# Patient Record
Sex: Female | Born: 1945 | Race: White | Hispanic: No | Marital: Married | Smoking: Never smoker
Health system: Southern US, Community
[De-identification: ages and names within clinical notes are randomized; demographics above are authoritative.]

## PROBLEM LIST (undated history)

## (undated) DIAGNOSIS — R51 Headache: Secondary | ICD-10-CM

## (undated) DIAGNOSIS — E039 Hypothyroidism, unspecified: Secondary | ICD-10-CM

## (undated) DIAGNOSIS — C801 Malignant (primary) neoplasm, unspecified: Secondary | ICD-10-CM

## (undated) DIAGNOSIS — I10 Essential (primary) hypertension: Secondary | ICD-10-CM

## (undated) DIAGNOSIS — G4730 Sleep apnea, unspecified: Secondary | ICD-10-CM

## (undated) DIAGNOSIS — R519 Headache, unspecified: Secondary | ICD-10-CM

## (undated) HISTORY — DX: Malignant (primary) neoplasm, unspecified: C80.1

## (undated) HISTORY — DX: Essential (primary) hypertension: I10

## (undated) HISTORY — PX: APPENDECTOMY: SHX54

## (undated) HISTORY — PX: SKIN CANCER EXCISION: SHX779

## (undated) HISTORY — PX: MOUTH SURGERY: SHX715

## (undated) HISTORY — PX: TEAR DUCT PROBING: SHX793

---

## 1998-06-13 ENCOUNTER — Encounter: Admission: RE | Admit: 1998-06-13 | Discharge: 1998-07-29 | Payer: Self-pay | Admitting: Internal Medicine

## 1998-07-12 ENCOUNTER — Encounter: Payer: Self-pay | Admitting: Internal Medicine

## 1998-07-12 ENCOUNTER — Ambulatory Visit (HOSPITAL_COMMUNITY): Admission: RE | Admit: 1998-07-12 | Discharge: 1998-07-12 | Payer: Self-pay | Admitting: Internal Medicine

## 1998-08-13 ENCOUNTER — Encounter: Admission: RE | Admit: 1998-08-13 | Discharge: 1998-11-11 | Payer: Self-pay | Admitting: Anesthesiology

## 1999-03-20 ENCOUNTER — Encounter: Admission: RE | Admit: 1999-03-20 | Discharge: 1999-06-18 | Payer: Self-pay | Admitting: Anesthesiology

## 2003-12-06 ENCOUNTER — Other Ambulatory Visit: Admission: RE | Admit: 2003-12-06 | Discharge: 2003-12-06 | Payer: Self-pay | Admitting: Obstetrics and Gynecology

## 2005-01-21 ENCOUNTER — Other Ambulatory Visit: Admission: RE | Admit: 2005-01-21 | Discharge: 2005-01-21 | Payer: Self-pay | Admitting: Obstetrics and Gynecology

## 2006-05-06 ENCOUNTER — Other Ambulatory Visit: Admission: RE | Admit: 2006-05-06 | Discharge: 2006-05-06 | Payer: Self-pay | Admitting: Obstetrics and Gynecology

## 2006-06-23 ENCOUNTER — Encounter: Admission: RE | Admit: 2006-06-23 | Discharge: 2006-06-23 | Payer: Self-pay | Admitting: Internal Medicine

## 2006-07-06 ENCOUNTER — Encounter (HOSPITAL_COMMUNITY): Admission: RE | Admit: 2006-07-06 | Discharge: 2006-07-07 | Payer: Self-pay | Admitting: Internal Medicine

## 2006-07-30 ENCOUNTER — Ambulatory Visit (HOSPITAL_COMMUNITY): Admission: RE | Admit: 2006-07-30 | Discharge: 2006-07-30 | Payer: Self-pay | Admitting: Internal Medicine

## 2006-07-30 ENCOUNTER — Encounter (INDEPENDENT_AMBULATORY_CARE_PROVIDER_SITE_OTHER): Payer: Self-pay | Admitting: *Deleted

## 2007-05-09 ENCOUNTER — Other Ambulatory Visit: Admission: RE | Admit: 2007-05-09 | Discharge: 2007-05-09 | Payer: Self-pay | Admitting: Obstetrics and Gynecology

## 2008-05-14 ENCOUNTER — Other Ambulatory Visit: Admission: RE | Admit: 2008-05-14 | Discharge: 2008-05-14 | Payer: Self-pay | Admitting: Obstetrics and Gynecology

## 2009-05-20 ENCOUNTER — Other Ambulatory Visit: Admission: RE | Admit: 2009-05-20 | Discharge: 2009-05-20 | Payer: Self-pay | Admitting: Obstetrics and Gynecology

## 2010-06-10 ENCOUNTER — Other Ambulatory Visit: Admission: RE | Admit: 2010-06-10 | Discharge: 2010-06-10 | Payer: Self-pay | Admitting: Obstetrics and Gynecology

## 2010-08-31 ENCOUNTER — Encounter: Payer: Self-pay | Admitting: Internal Medicine

## 2010-10-23 ENCOUNTER — Other Ambulatory Visit: Payer: Self-pay | Admitting: Dermatology

## 2010-12-22 ENCOUNTER — Encounter (INDEPENDENT_AMBULATORY_CARE_PROVIDER_SITE_OTHER): Payer: Self-pay | Admitting: Surgery

## 2011-03-24 ENCOUNTER — Other Ambulatory Visit (INDEPENDENT_AMBULATORY_CARE_PROVIDER_SITE_OTHER): Payer: Self-pay | Admitting: Surgery

## 2011-03-24 DIAGNOSIS — E042 Nontoxic multinodular goiter: Secondary | ICD-10-CM

## 2011-03-26 ENCOUNTER — Ambulatory Visit
Admission: RE | Admit: 2011-03-26 | Discharge: 2011-03-26 | Disposition: A | Discharge status: Home / Self Care | Payer: Medicare Other | Source: Ambulatory Visit | Attending: Surgery | Admitting: Surgery

## 2011-03-26 DIAGNOSIS — E042 Nontoxic multinodular goiter: Secondary | ICD-10-CM

## 2011-04-16 ENCOUNTER — Ambulatory Visit (INDEPENDENT_AMBULATORY_CARE_PROVIDER_SITE_OTHER): Payer: Medicare Other | Admitting: Surgery

## 2011-04-16 ENCOUNTER — Encounter (INDEPENDENT_AMBULATORY_CARE_PROVIDER_SITE_OTHER): Payer: Self-pay | Admitting: Surgery

## 2011-04-16 VITALS — BP 136/80 | HR 80

## 2011-04-16 DIAGNOSIS — E042 Nontoxic multinodular goiter: Secondary | ICD-10-CM

## 2011-04-16 NOTE — Progress Notes (Signed)
Visit Diagnoses: 1. Multinodular goiter (nontoxic)     HISTORY: Patient is a 65 year old female followed over several years for dominant left thyroid nodule and it has been at least 6 years since a needle biopsy was performed showing nonneoplastic goiter. Sequential ultrasound scanning had shown a slow gradual increase in size of the dominant nodule in the left lobe. It now measures 3.5 cm. It does contain microcalcifications. There is a second nodule in the upper pole of the left thyroid lobe measuring 1.8 cm in size. Patient returns today to discuss these ultrasound results.   PERTINENT REVIEW OF SYSTEMS: Patient denies any compressive symptoms. She denies tremors. She denies palpitations.   EXAM: HEENT: normocephalic; pupils equal and reactive; sclerae clear; dentition good; mucous membranes moist NECK:  Palpation of the left thyroid lobe shows a dominant mass extending from the clavicle through the midportion of the left lobe measuring at least 3 cm in greatest dimension. It is moderately firm. Right lobe is without palpable nodularity.asymmetric on extension; no palpable anterior or posterior cervical lymphadenopathy; no supraclavicular masses; no tenderness CHEST: clear to auscultation bilaterally without rales, rhonchi, or wheezes CARDIAC: regular rate and rhythm without significant murmur; peripheral pulses are full EXT:  non-tender without edema; no deformity NEURO: no gross focal deficits; no sign of tremor   IMPRESSION: Dominant left thyroid nodule, 3.5 cm, with calcifications, gradual enlargement while on thyroid hormone suppression   PLAN: I discussed options for management with the patient and her husband. I have recommended repeat fine-needle aspiration biopsy of the dominant left thyroid nodule and, if possible, biopsy of the 1.8 cm nodule which is in the upper pole of the left thyroid lobe. We will obtain this procedure from the radiologist in the near future. I will  contact her with the cytopathology results. If it is completely benign then I feel it is safe to follow this for another year. If there is any atypia or evidence of malignancy she will require a thyroidectomy. At this point the patient does not desire to proceed to thyroidectomy without cytologic evidence of malignancy.  We will contact her with the biopsy results when they are available.   Velora Heckler, MD, FACS General & Endocrine Surgery [facility]

## 2011-04-17 ENCOUNTER — Ambulatory Visit
Admission: RE | Admit: 2011-04-17 | Discharge: 2011-04-17 | Disposition: A | Discharge status: Home / Self Care | Payer: Medicare Other | Source: Ambulatory Visit | Attending: Surgery | Admitting: Surgery

## 2011-04-17 ENCOUNTER — Other Ambulatory Visit (HOSPITAL_COMMUNITY)
Admission: RE | Admit: 2011-04-17 | Discharge: 2011-04-17 | Disposition: A | Discharge status: Home / Self Care | Payer: Medicare Other | Source: Ambulatory Visit | Attending: Interventional Radiology | Admitting: Interventional Radiology

## 2011-04-17 DIAGNOSIS — E049 Nontoxic goiter, unspecified: Secondary | ICD-10-CM | POA: Insufficient documentation

## 2011-04-17 DIAGNOSIS — E042 Nontoxic multinodular goiter: Secondary | ICD-10-CM

## 2011-04-20 NOTE — Progress Notes (Signed)
Quick Note:  Please contact patient with benign path results. TMG ______ 

## 2011-04-22 ENCOUNTER — Other Ambulatory Visit (INDEPENDENT_AMBULATORY_CARE_PROVIDER_SITE_OTHER): Payer: Self-pay | Admitting: Surgery

## 2011-04-22 ENCOUNTER — Telehealth (INDEPENDENT_AMBULATORY_CARE_PROVIDER_SITE_OTHER): Payer: Self-pay

## 2011-04-22 DIAGNOSIS — E042 Nontoxic multinodular goiter: Secondary | ICD-10-CM

## 2011-04-22 NOTE — Telephone Encounter (Signed)
Rx for synthroid 0.088mg  called to mail order [phone] verbal r/f for 90days x 3 rmp

## 2011-04-22 NOTE — Progress Notes (Signed)
Patient aware of results. rmp

## 2011-06-29 ENCOUNTER — Other Ambulatory Visit: Payer: Self-pay | Admitting: Obstetrics and Gynecology

## 2011-06-29 ENCOUNTER — Other Ambulatory Visit (HOSPITAL_COMMUNITY)
Admission: RE | Admit: 2011-06-29 | Discharge: 2011-06-29 | Disposition: A | Discharge status: Home / Self Care | Payer: Medicare Other | Source: Ambulatory Visit | Attending: Obstetrics and Gynecology | Admitting: Obstetrics and Gynecology

## 2011-06-29 DIAGNOSIS — Z1159 Encounter for screening for other viral diseases: Secondary | ICD-10-CM | POA: Insufficient documentation

## 2011-06-29 DIAGNOSIS — Z124 Encounter for screening for malignant neoplasm of cervix: Secondary | ICD-10-CM | POA: Insufficient documentation

## 2012-04-21 ENCOUNTER — Ambulatory Visit
Admission: RE | Admit: 2012-04-21 | Discharge: 2012-04-21 | Disposition: A | Discharge status: Home / Self Care | Payer: Medicare Other | Source: Ambulatory Visit | Attending: Surgery | Admitting: Surgery

## 2012-04-21 DIAGNOSIS — E042 Nontoxic multinodular goiter: Secondary | ICD-10-CM

## 2012-05-02 ENCOUNTER — Encounter (INDEPENDENT_AMBULATORY_CARE_PROVIDER_SITE_OTHER): Payer: Self-pay | Admitting: Surgery

## 2012-05-02 ENCOUNTER — Other Ambulatory Visit (INDEPENDENT_AMBULATORY_CARE_PROVIDER_SITE_OTHER): Payer: Self-pay

## 2012-05-02 ENCOUNTER — Ambulatory Visit (INDEPENDENT_AMBULATORY_CARE_PROVIDER_SITE_OTHER): Payer: Medicare Other | Admitting: Surgery

## 2012-05-02 VITALS — BP 140/78 | HR 76 | Temp 98.0°F | Ht 64.0 in | Wt 143.8 lb

## 2012-05-02 DIAGNOSIS — E042 Nontoxic multinodular goiter: Secondary | ICD-10-CM

## 2012-05-02 NOTE — Progress Notes (Signed)
General Surgery [facility]  Visit Diagnoses: 1. Multinodular goiter (nontoxic)     HISTORY: The patient is a 66 year old white female followed for many years with multinodular thyroid goiter. She has had a dominant nodule in the left thyroid lobe which has gradually increased in size. She has undergone fine-needle aspiration biopsy on 2 occasions. Both of these cytopathology results were benign. Patient remains on thyroid hormone suppressive therapy with levothyroxine 88 mcg daily. This is monitored by her primary care physician.  At my request the patient underwent a followup thyroid ultrasound performed on 04/21/2012. This shows a slightly enlarged thyroid gland with the right lobe measuring 5.2 cm in the left lobe measuring 5.6 cm. There are multiple nodules bilaterally. The dominant nodule in the right lobe measures 1.8 cm. The dominant nodule in the left lobe measures 3.5 cm and is unchanged from prior studies.  PERTINENT REVIEW OF SYSTEMS: Denies tremors. Denies palpitations. Denies compressive symptoms. Denies new masses.  EXAM: HEENT: normocephalic; pupils equal and reactive; sclerae clear; dentition good; mucous membranes moist NECK:  Dominant nodule palpable in the inferior pole of the left thyroid lobe, mobile with swallowing, and nontender; dominant nodule mid right thyroid lobe, approximately 2 cm, mobile with swallowing; symmetric on extension; no palpable anterior or posterior cervical lymphadenopathy; no supraclavicular masses; no tenderness CHEST: clear to auscultation bilaterally without rales, rhonchi, or wheezes CARDIAC: regular rate and rhythm without significant murmur; peripheral pulses are full EXT:  non-tender without edema; no deformity NEURO: no gross focal deficits; no sign of tremor   IMPRESSION: #1 multinodular thyroid goiter, nontoxic #2 dominant nodule left thyroid lobe, 3.5 cm, clinically stable #3 hypothyroidism  PLAN: I discussed  the above findings with the patient and her family. At this point there is no absolute indication for thyroidectomy. She will remain on levothyroxine 88 mcg daily. I suspect her primary care physician we'll check a TSH level during her annual physical examination.  Since her examination and studies have been stable for several years, we will not repeat her thyroid ultrasound until 2 years from now. I will also see her back at that time for physical examination.  Velora Heckler, MD, [facility] Office: [phone]

## 2012-05-02 NOTE — Patient Instructions (Signed)
Thyroid Diseases Your thyroid is a butterfly-shaped gland in your neck. It is located just above your collarbone. It is one of your endocrine glands, which make hormones. The thyroid helps set your metabolism. Metabolism is how your body gets energy from the foods you eat.  Millions of people have thyroid diseases. Women experience thyroid problems more often than men. In fact, overactive thyroid problems (hyperthyroidism) occur in 1% of all women. If you have a thyroid disease, your body may use energy more slowly or quickly than it should.  Thyroid problems also include an immune disease where your body reacts against your thyroid gland (called thyroiditis). A different problem involves lumps and bumps (called nodules) that develop in the gland. The nodules are usually, but not always, noncancerous. THE MOST COMMON THYROID PROBLEMS AND CAUSES ARE DISCUSSED BELOW There are many causes for thyroid problems. Treatment depends upon the exact diagnosis and includes trying to reset your body's metabolism to a normal rate. Hyperthyroidism Too much thyroid hormone from an overactive thyroid gland is called hyperthyroidism. In hyperthyroidism, the body's metabolism speeds up. One of the most frequent forms of hyperthyroidism is known as Graves' disease. Graves' disease tends to run in families. Although Graves' is thought to be caused by a problem with the immune system, the exact nature of the genetic problem is unknown. Hypothyroidism Too little thyroid hormone from an underactive thyroid gland is called hypothyroidism. In hypothyroidism, the body's metabolism is slowed. Several things can cause this condition. Most causes affect the thyroid gland directly and hurt its ability to make enough hormone.  Rarely, there may be a pituitary gland tumor (located near the base of the brain). The tumor can block the pituitary from producing thyroid-stimulating hormone (TSH). Your body makes TSH to stimulate the thyroid  to work properly. If the pituitary does not make enough TSH, the thyroid fails to make enough hormones needed for good health. Whether the problem is caused by thyroid conditions or by the pituitary gland, the result is that the thyroid is not making enough hormones. Hypothyroidism causes many physical and mental processes to become sluggish. The body consumes less oxygen and produces less body heat. Thyroid Nodules A thyroid nodule is a small swelling or lump in the thyroid gland. They are common. These nodules represent either a growth of thyroid tissue or a fluid-filled cyst. Both form a lump in the thyroid gland. Almost half of all people will have tiny thyroid nodules at some point in their lives. Typically, these are not noticeable until they become large and affect normal thyroid size. Larger nodules that are greater than a half inch across (about 1 centimeter) occur in about 5 percent of people. Although most nodules are not cancerous, people who have them should seek medical care to rule out cancer. Also, some thyroid nodules may produce too much thyroid hormone or become too large. Large nodules or a large gland can interfere with breathing or swallowing or may cause neck discomfort. Other problems Other thyroid problems include cancer and thyroiditis. Thyroiditis is a malfunction of the body's immune system. Normally, the immune system works to defend the body against infection and other problems. When the immune system is not working properly, it may mistakenly attack normal cells, tissues, and organs. Examples of autoimmune diseases are Hashimoto's thyroiditis (which causes low thyroid function) and Graves' disease (which causes excess thyroid function). SYMPTOMS  Symptoms vary greatly depending upon the exact type of problem with the thyroid. Hyperthyroidism-is when your thyroid is too   active and makes more thyroid hormone than your body needs. The most common cause is Graves' Disease. Too  much thyroid hormone can cause some or all of the following symptoms:  Anxiety.   Irritability.   Difficulty sleeping.   Fatigue.   A rapid or irregular heartbeat.   A fine tremor of your hands or fingers.   An increase in perspiration.   Sensitivity to heat.   Weight loss, despite normal food intake.   Brittle hair.   Enlargement of your thyroid gland (goiter).   Light menstrual periods.   Frequent bowel movements.  Graves' disease can specifically cause eye and skin problems. The skin problems involve reddening and swelling of the skin, often on your shins and on the top of your feet. Eye problems can include the following:  Excess tearing and sensation of grit or sand in either or both eyes.   Reddened or inflamed eyes.   Widening of the space between your eyelids.   Swelling of the lids and tissues around the eyes.   Light sensitivity.   Ulcers on the cornea.   Double vision.   Limited eye movements.   Blurred or reduced vision.  Hypothyroidism- is when your thyroid gland is not active enough. This is more common than hyperthyroidism. Symptoms can vary a lot depending of the severity of the hormone deficiency. Symptoms may develop over a long period of time and can include several of the following:  Fatigue.   Sluggishness.   Increased sensitivity to cold.   Constipation.   Pale, dry skin.   A puffy face.   Hoarse voice.   High blood cholesterol level.   Unexplained weight gain.   Muscle aches, tenderness and stiffness.   Pain, stiffness or swelling in your joints.   Muscle weakness.   Heavier than normal menstrual periods.   Brittle fingernails and hair.   Depression.  Thyroid Nodules - most do not cause signs or symptoms. Occasionally, some may become so large that you can feel or even see the swelling at the base of your neck. You may realize a lump or swelling is there when you are shaving or putting on makeup. Men might become  aware of a nodule when shirt collars suddenly feel too tight. Some nodules produce too much thyroid hormone. This can produce the same symptoms as hyperthyroidism (see above). Thyroid nodules are seldom cancerous. However, a nodule is more likely to be malignant (cancerous) if it:  Grows quickly or feels hard.   Causes you to become hoarse or to have trouble swallowing or breathing.   Causes enlarged lymph nodes under your jaw or in your neck.  DIAGNOSIS  Because there are so many possible thyroid conditions, your caregiver may ask for a number of tests. They will do this in order to narrow down the exact diagnosis. These tests can include:  Blood and antibody tests.   Special thyroid scans using small, safe amounts of radioactive iodine.   Ultrasound of the thyroid gland (particularly if there is a nodule or lump).   Biopsy. This is usually done with a special needle. A needle biopsy is a procedure to obtain a sample of cells from the thyroid. The tissue will be tested in a lab and examined under a microscope.  TREATMENT  Treatment depends on the exact diagnosis. Hyperthyroidism  Beta-blockers help relieve many of the symptoms.   Anti-thyroid medications prevent the thyroid from making excess hormones.   Radioactive iodine treatment can destroy overactive thyroid   cells. The iodine can permanently decrease the amount of hormone produced.   Surgery to remove the thyroid gland.   Treatments for eye problems that come from Graves' disease also include medications and special eye surgery, if felt to be appropriate.  Hypothyroidism Thyroid replacement with levothyroxine is the mainstay of treatment. Treatment with thyroid replacement is usually lifelong and will require monitoring and adjustment from time to time. Thyroid Nodules  Watchful waiting. If a small nodule causes no symptoms or signs of cancer on biopsy, then no treatment may be chosen at first. Re-exam and re-checking blood  tests would be the recommended follow-up.   Anti-thyroid medications or radioactive iodine treatment may be recommended if the nodules produce too much thyroid hormone (see Treatment for Hyperthyroidism above).   Alcohol ablation. Injections of small amounts of ethyl alcohol (ethanol) can cause a non-cancerous nodule to shrink in size.   Surgery (see Treatment for Hyperthyroidism above).  HOME CARE INSTRUCTIONS   Take medications as instructed.   Follow through on recommended testing.  SEEK MEDICAL CARE IF:   You feel that you are developing symptoms of Hyperthyroidism or Hypothyroidism as described above.   You develop a new lump/nodule in the neck/thyroid area that you had not noticed before.   You feel that you are having side effects from medicines prescribed.   You develop trouble breathing or swallowing.  SEEK IMMEDIATE MEDICAL CARE IF:   You develop a fever of 102 F (38.9 C) or higher.   You develop severe sweating.   You develop palpitations and/or rapid heart beat.   You develop shortness of breath.   You develop nausea and vomiting.   You develop extreme shakiness.   You develop agitation.   You develop lightheadedness or have a fainting episode.  Document Released: 05/24/2007 Document Revised: 07/16/2011 Document Reviewed: 05/24/2007 [employer] Patient Information 2012 [employer]. 

## 2012-08-28 IMAGING — US US THYROID BIOPSY
1 series · 9 of 9 positions shown · non-contrast
Comparison: none

CLINICAL DATA: Enlarging dominant left thyroid nodule.

ULTRASOUND-GUIDED THYROID ASPIRATION BIOPSY
TECHNIQUE: Survey ultrasound was performed and the dominant lesion
in the lower pole left lobe was localized.  An appropriate skin
entry site was determined.  Skin was marked, then prepped with
Betadine, draped in usual sterile fashion, and infiltrated locally
with 1% lidocaine.  Under real-time ultrasound guidance, 4  passes
were made into the lesion with 25 gauge needles.  The patient
tolerated procedure well, with no immediate complications.
IMPRESSION
1.  Technically successful ultrasound-guided thyroid aspiration
biopsy

[Series 1: us thyroid biopsy · 0.08mm/px · 9 acquisitions, 9 frames shown]
[im 1/9]
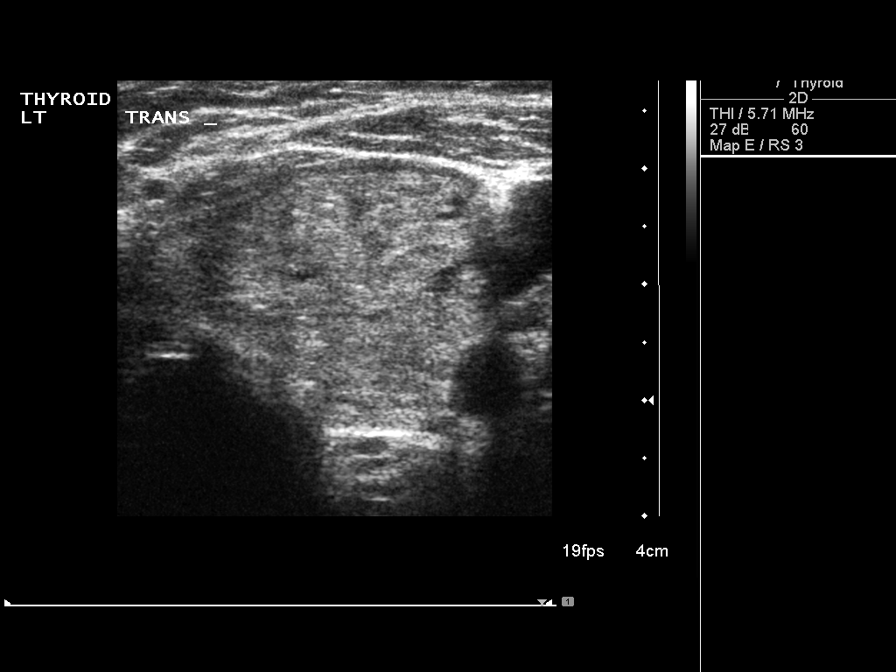
[im 2/9]
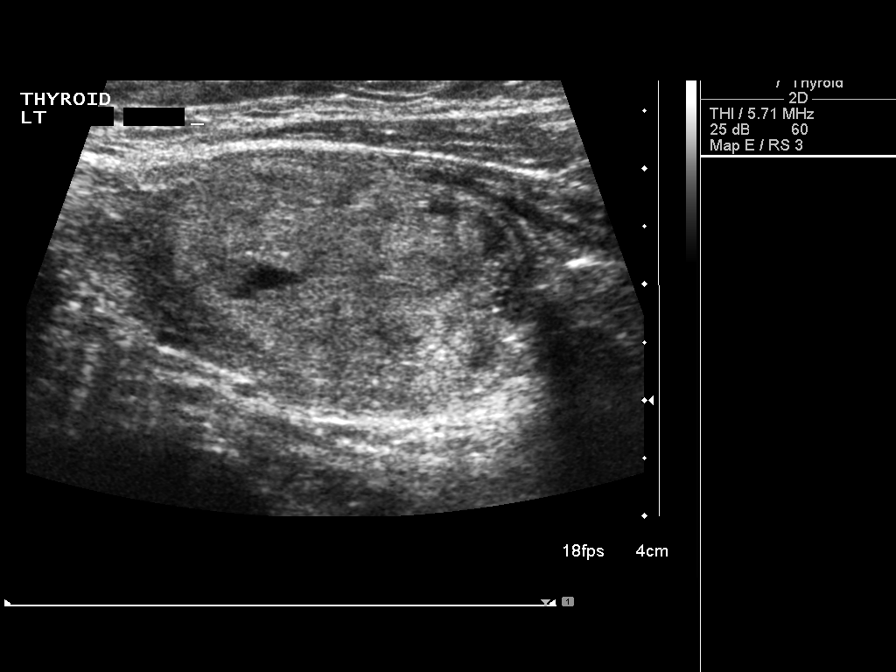
[im 3/9]
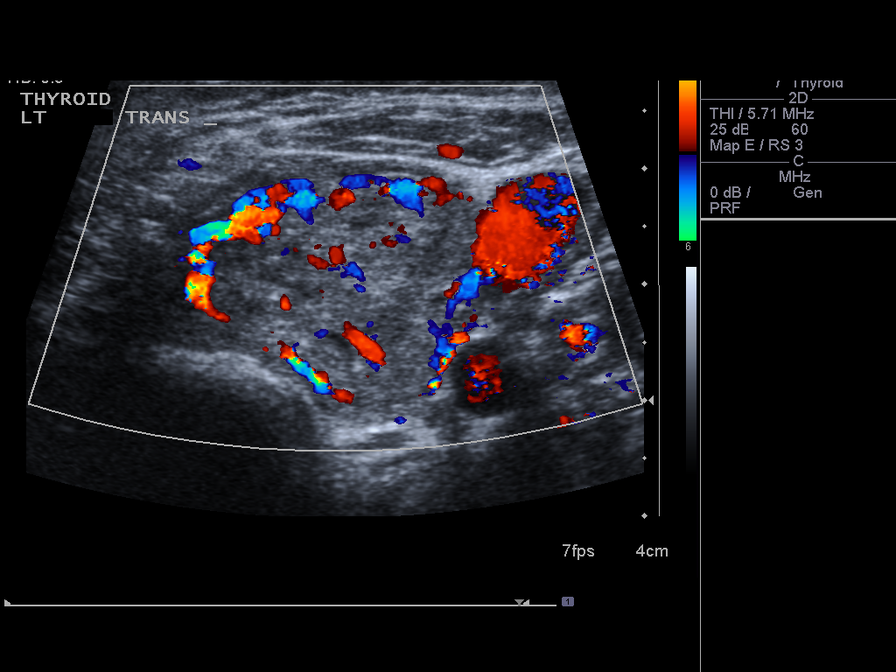
[im 4/9]
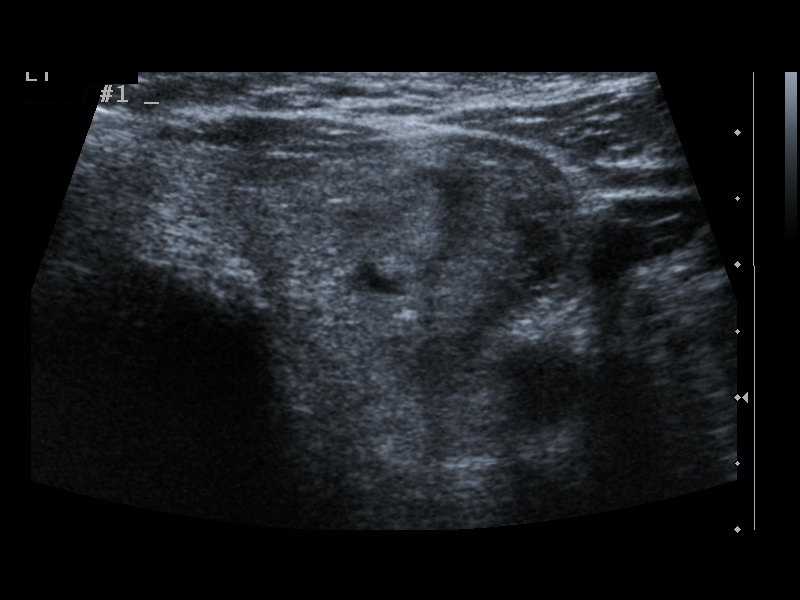
[im 5/9]
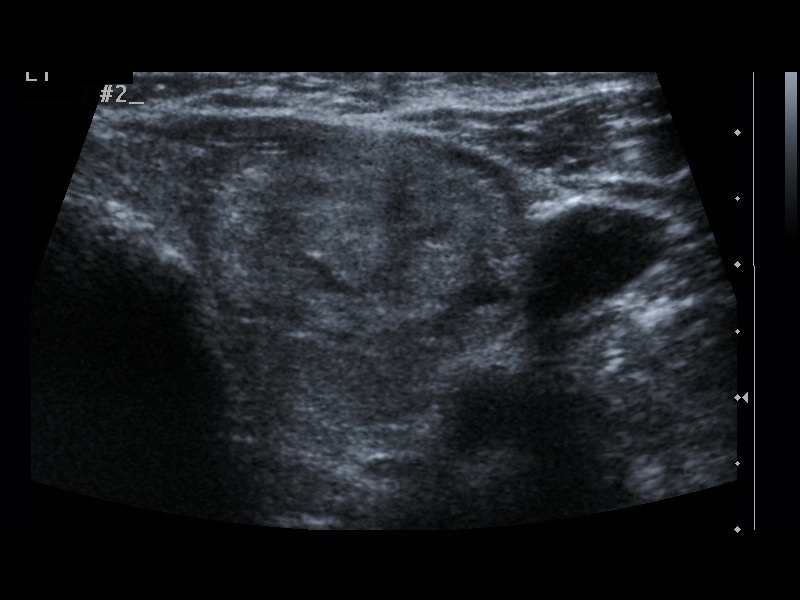
[im 6/9]
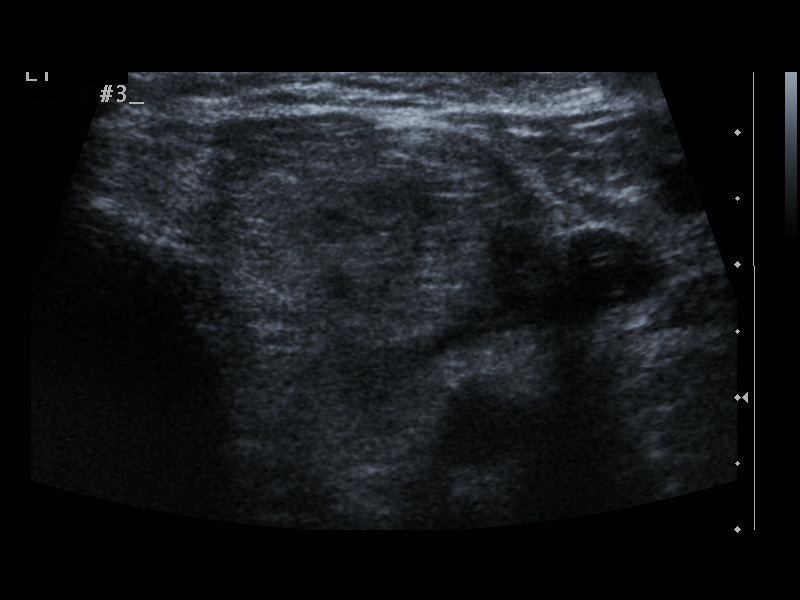
[im 7/9]
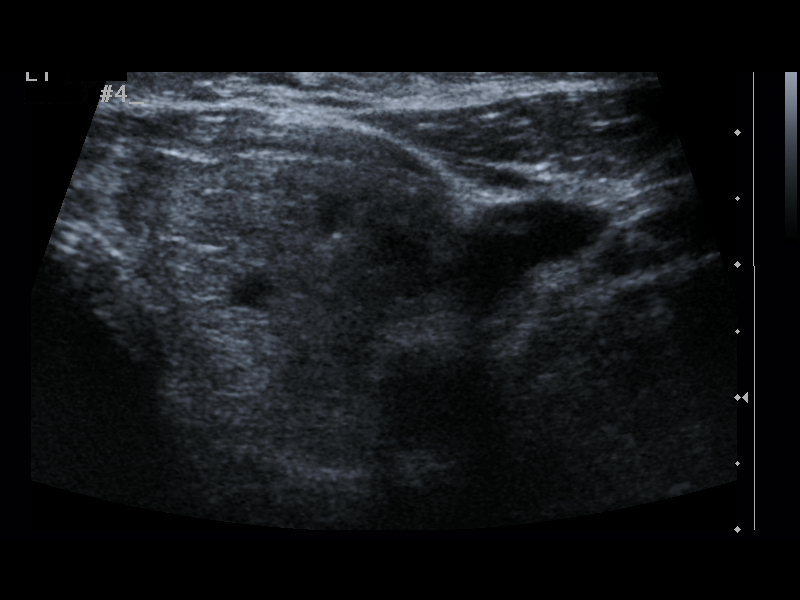
[im 8/9]
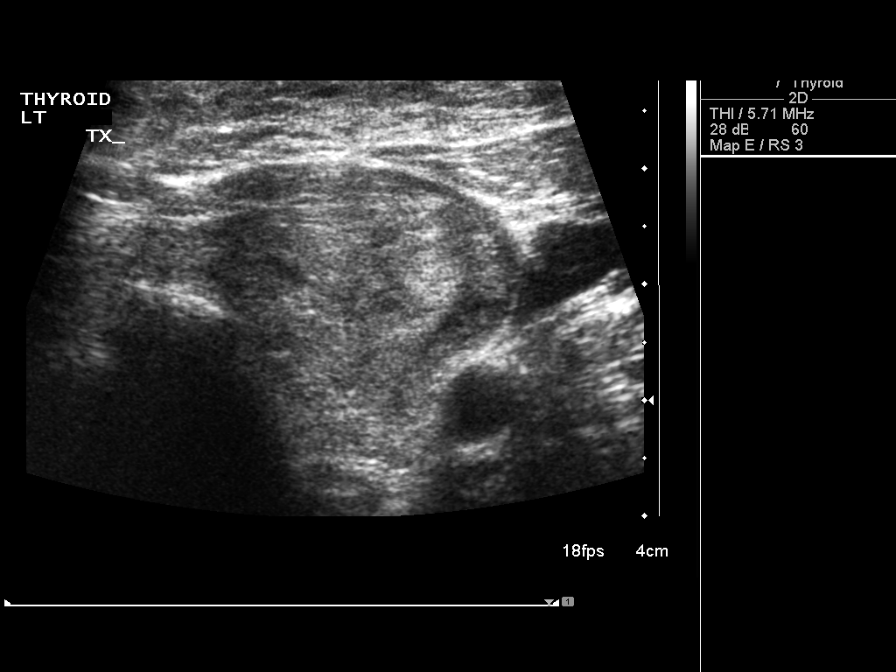
[im 9/9]
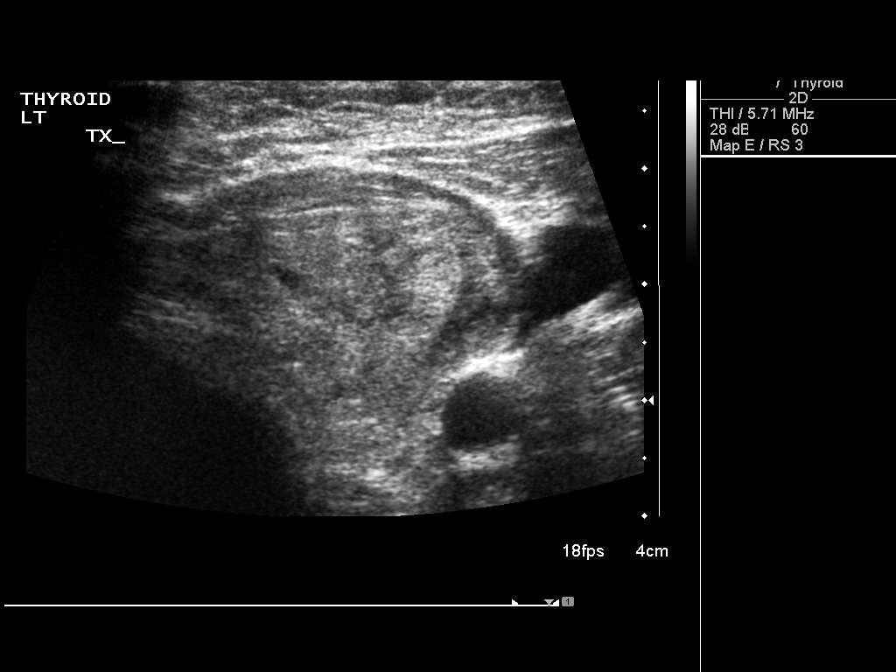

[9 of 9 positions shown; findings below may reference images not displayed]

## 2012-09-28 ENCOUNTER — Other Ambulatory Visit: Payer: Self-pay | Admitting: Dermatology

## 2012-11-08 ENCOUNTER — Other Ambulatory Visit: Payer: Self-pay | Admitting: Dermatology

## 2013-09-02 IMAGING — US US SOFT TISSUE HEAD/NECK
1 series · 14 of 25 positions shown · non-contrast
Comparison: Ultrasound of the thyroid of 03/26/2011

CLINICAL DATA: Follow up of thyroid nodules

THYROID ULTRASOUND
TECHNIQUE: Ultrasound examination of the thyroid gland and adjacent
soft tissues was performed.

[Series 1: us soft tissue head/neck · 0.07mm/px · 14 of 74 slices shown]
[im 1/74]
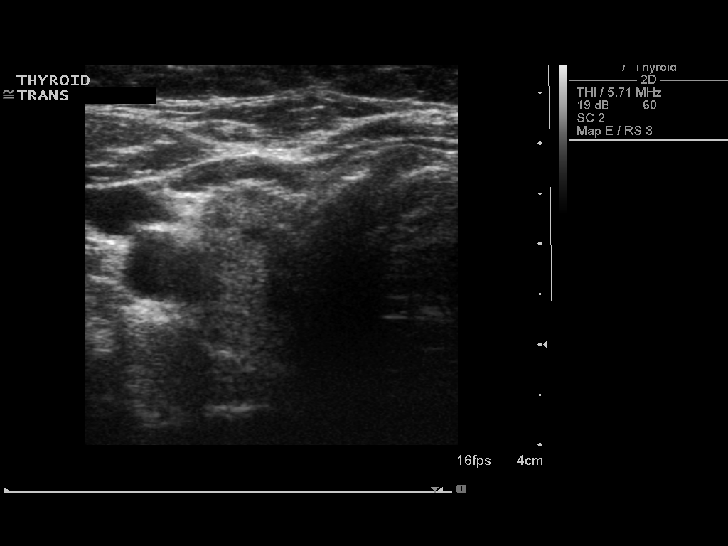
[im 7/74]
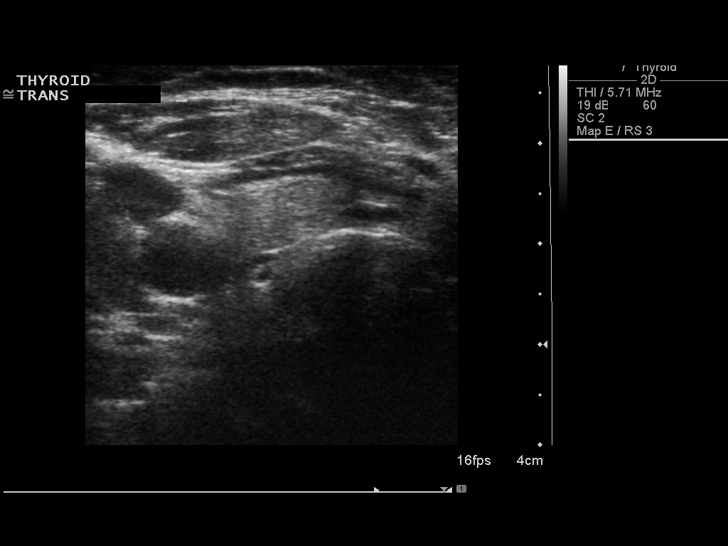
[im 13/74]
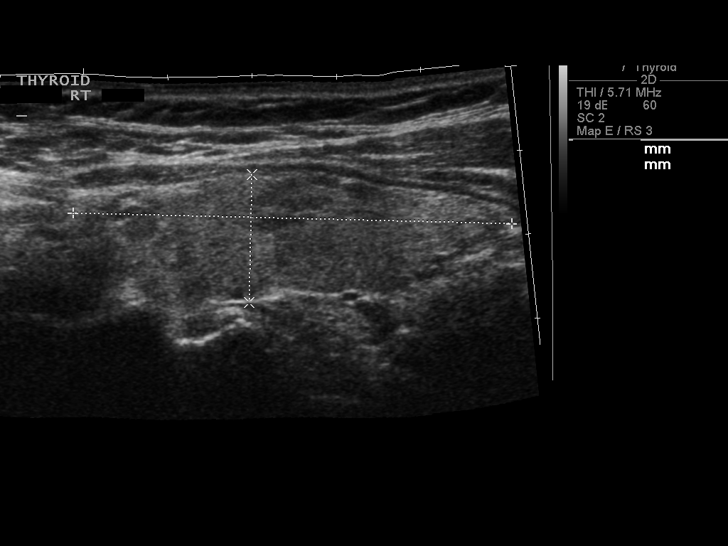
[im 19/74]
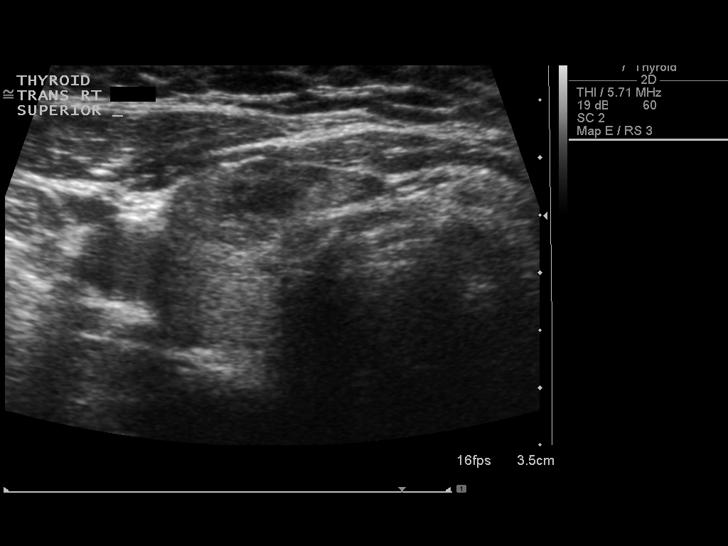
[im 25/74]
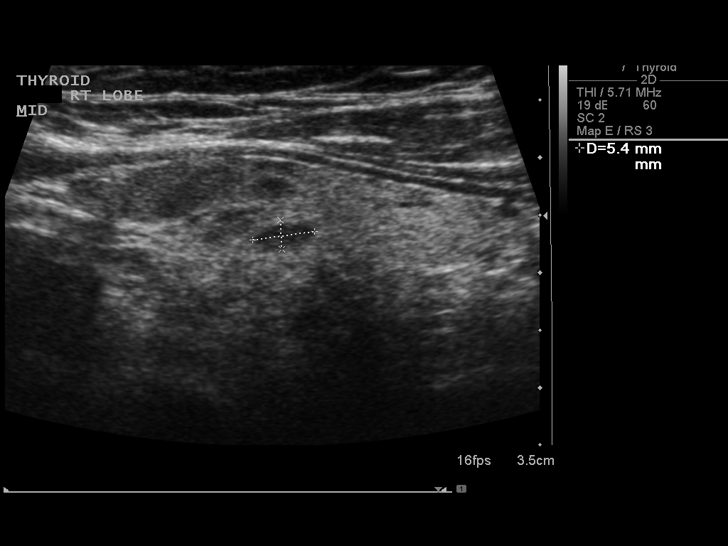
[im 28/74]
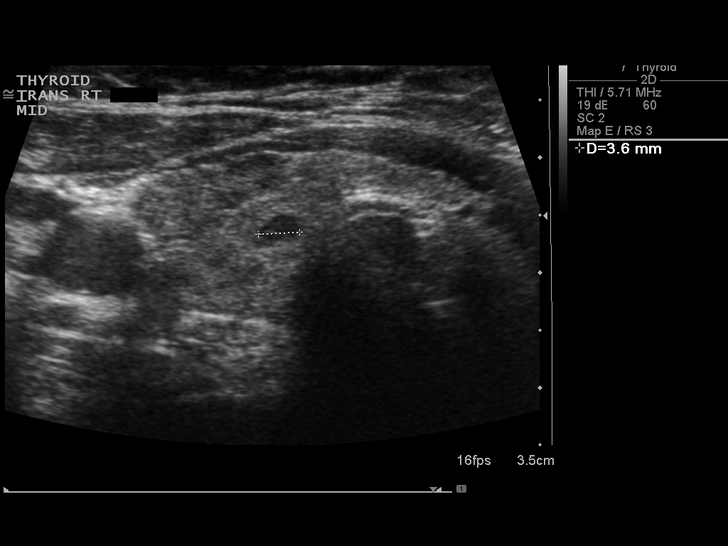
[im 34/74]
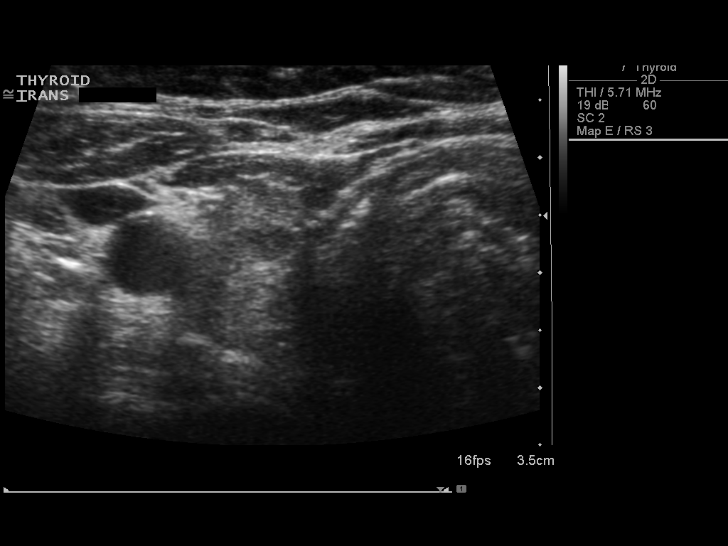
[im 40/74]
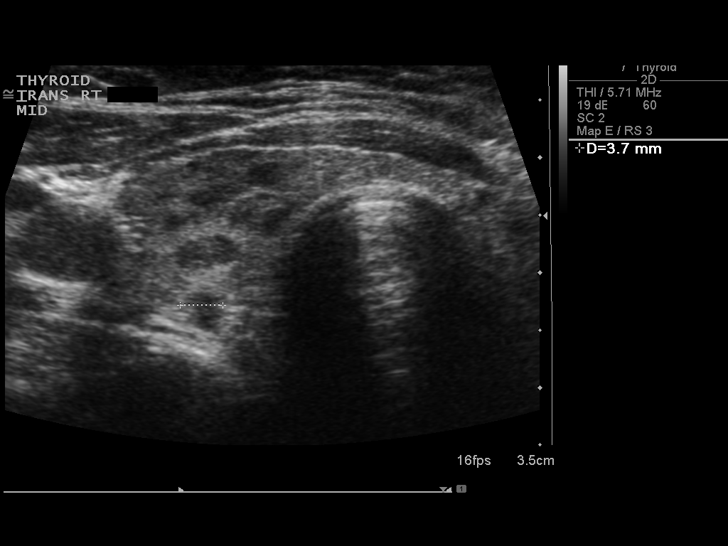
[im 46/74]
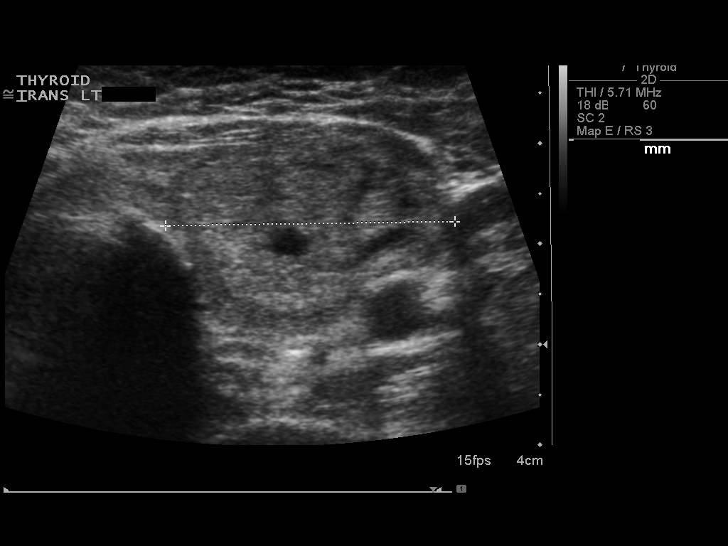
[im 49/74]
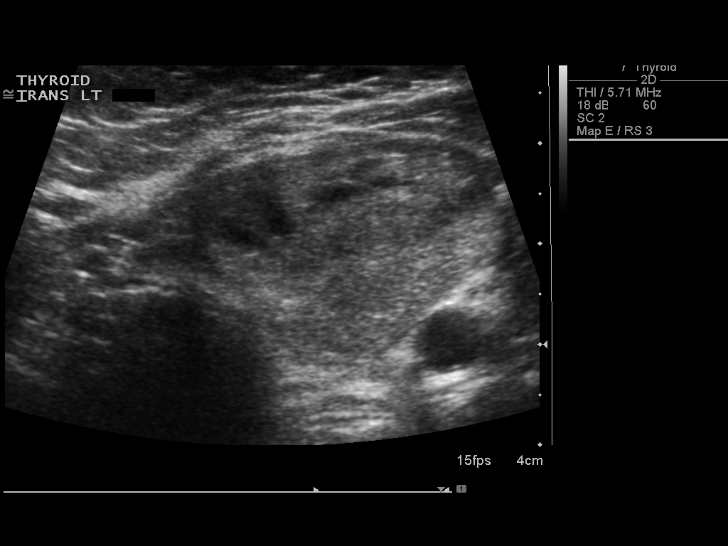
[im 55/74]
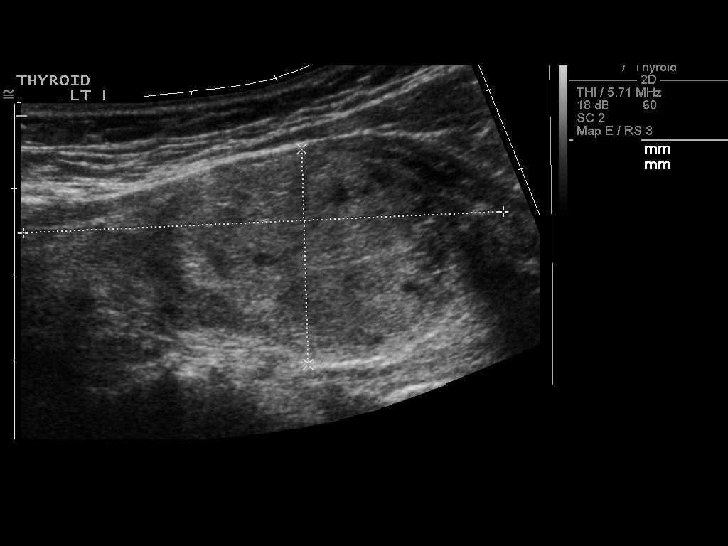
[im 61/74]
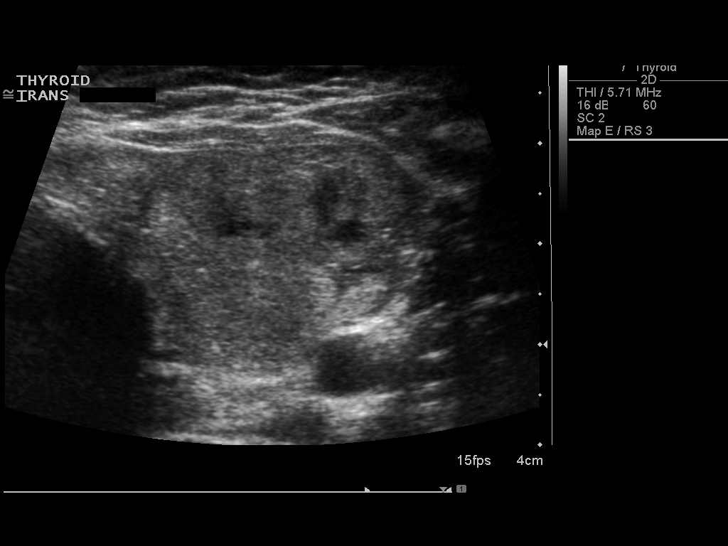
[im 67/74]
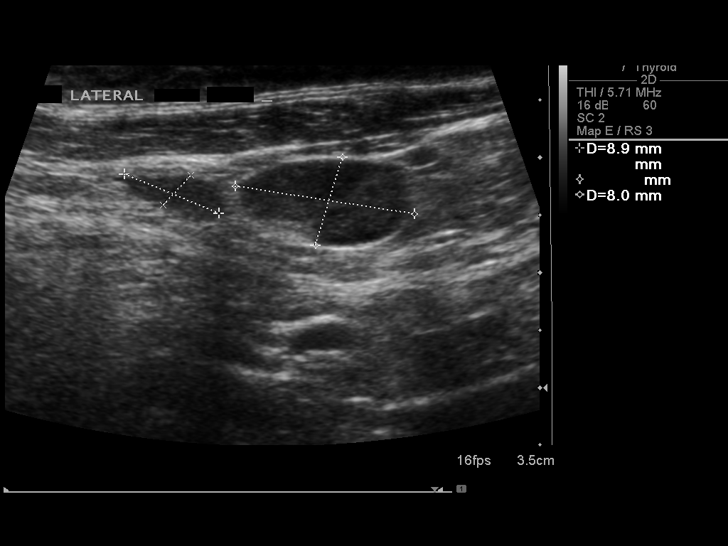
[im 74/74]
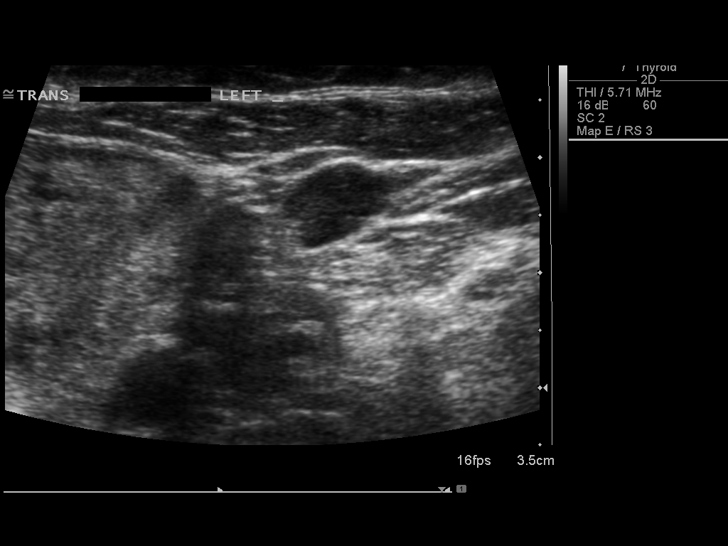

[14 of 25 positions shown; findings below may reference images not displayed]

FINDINGS: Right thyroid lobe:  5.2 x 1.5 x 1.5 cm.  (Previously 5.2 x 1.5 x
1.5 cm).
Left thyroid lobe:   5.6 x 2.5 x 2.9 cm.  (Previously 4.7 x 2.0 x
2.5 cm).
Isthmus:  3 mm in thickness.

Focal nodules:  The echogenicity of the thyroid gland is
inhomogeneous.  There are multiple nodules bilaterally.  The
dominant nodule is solid in the mid left lobe and contains
calcifications, with this nodule measuring 3.5 x 2.4 x 2.7 cm,
compared to prior measurements of 3.5 x 2.1 x 2.5 cm.  A solid
nodule in the upper pole of the right measures 1.8 x 0.6 x 1.0 cm
compared to prior measurements of 1.8 x 0.5 x 1.0 cm.  Smaller
nodules are present of no more than 7 mm in diameter.

Lymphadenopathy:  None visualized.
IMPRESSION: Stable thyroid nodules with the dominant nodule again noted in the
left lobe measuring 3.5 x 2.4 x 2.7 cm.

## 2014-05-21 ENCOUNTER — Other Ambulatory Visit (INDEPENDENT_AMBULATORY_CARE_PROVIDER_SITE_OTHER): Payer: Self-pay | Admitting: *Deleted

## 2014-05-21 DIAGNOSIS — E042 Nontoxic multinodular goiter: Secondary | ICD-10-CM

## 2014-05-22 ENCOUNTER — Other Ambulatory Visit: Payer: Medicare Other

## 2014-05-23 ENCOUNTER — Encounter (INDEPENDENT_AMBULATORY_CARE_PROVIDER_SITE_OTHER): Payer: Self-pay

## 2014-05-23 ENCOUNTER — Ambulatory Visit
Admission: RE | Admit: 2014-05-23 | Discharge: 2014-05-23 | Disposition: A | Discharge status: Home / Self Care | Payer: Medicare Other | Source: Ambulatory Visit | Attending: Surgery | Admitting: Surgery

## 2014-06-13 ENCOUNTER — Other Ambulatory Visit (INDEPENDENT_AMBULATORY_CARE_PROVIDER_SITE_OTHER): Payer: Self-pay

## 2014-06-13 DIAGNOSIS — E041 Nontoxic single thyroid nodule: Secondary | ICD-10-CM

## 2014-06-29 ENCOUNTER — Other Ambulatory Visit (HOSPITAL_COMMUNITY)
Admission: RE | Admit: 2014-06-29 | Discharge: 2014-06-29 | Disposition: A | Discharge status: Home / Self Care | Payer: Medicare Other | Source: Ambulatory Visit | Attending: Obstetrics and Gynecology | Admitting: Obstetrics and Gynecology

## 2014-06-29 ENCOUNTER — Other Ambulatory Visit: Payer: Self-pay | Admitting: Obstetrics and Gynecology

## 2014-06-29 DIAGNOSIS — Z124 Encounter for screening for malignant neoplasm of cervix: Secondary | ICD-10-CM | POA: Insufficient documentation

## 2014-06-29 DIAGNOSIS — Z1151 Encounter for screening for human papillomavirus (HPV): Secondary | ICD-10-CM | POA: Insufficient documentation

## 2014-07-02 LAB — CYTOLOGY - PAP

## 2014-10-24 NOTE — H&P (View-Only) (Signed)
 Oral and Maxillofacial Surgery Outpatient Clinic Note 10/16/2014  Assessment:   Jeanne Hill is a 69 y.o. female who presents for preoperative evaluation in preparation for torus mandibularis removal. Due to the extensive nature of the surgery it would be best to complete the procedure under general anesthesia.   Plan:   Patient will be posted for the OR for excision of mandibular tori and mandibular alveoplasty under general anesthesia. Surgical risks and benefits were discussed with the patient, including but not limited to pain and swelling, bleeding, infection, temporary or permanent numbness, damage to adjacent teeth or structures and hematoma formation. All patient questions were answered. Patient has elected to proceed with procedure. Written and verbal consent obtained. Unasyn 3 g IV will be placed on call to OR. Patient to be placed in TEDs/SCDs. Will request nasal endotrachial intubation. Patient will be discharged to home after the procedure.  Patient evaluated with Dr. Jake who is in agreement with the assessment and plan.   Subjective:   Referring Provider: Beryl Shine DDS [address] [place], [place] [address] [phone]  Reason for Visit: Preoperative evaluation  History of Present Illness: Jeanne Hill is a 69 y.o. 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 Patient reports the bony protuberances have been getting larger over the past several years, causing discomfort to her tongue and making it more difficult to eat and talk. She was evaluated by her primary dentist who referred her to [facility] for management.   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 Darice Sol in [place], [place]. The patient was evaluated by her primary care physician on February 8 in preparation for surgery.   Personal Surgical History:  - Appendectomy - Tonsillectomy - Tubal ligation - Tear duct surgery - Mohs chemosurgery left face 2011 - Laser surgery for retinal tear 2012 - Denies adverse reaction to anesthesia or bleeding problems with the   Allergies: NKDA  Medications: Calcium Melatonin 5 mg daily at bedtime Lisinopril  10 mg daily Hydrochlorothiazide 25 mg daily Nasalcrom aerosol solution 1 puff each nostril PRN Vitamin D Synthroid  88 mcg daily Provera 2.5 mg once a day Estradiol 1 mg tablet daily Cetirizine 10 mg tablet daily DHA 450 BID (type of fish oil)  Patient denies history of bisphosphonate use  Social History: Patient is married. Lives in [place]. Tob - never smoker. EtOH - occasionally. Illicits - denies.   Family History: Father is deceased, died in an accident. Mother's history unknown has not seen her mother in over 25 years. Patient has 1 brother and 1 sister both are alive, her sister has diabetes and her brother has a Visual merchandiser. Patient denies any family history of coagulopathy or difficulty under anaesthesia.  Review of Systems: The balance of more than 10 systems reviewed were negative except as noted in the HPI. Reports 5 times in past right eye unable to look up, most recently a month ago. Ophtho eval revealed no abnormalities.   Objective:    Physical Examination: Vitals: 36.2 C,  RR 20  71 bpm, 128/66, 98% on RA. Weight:  66.7kg Height 163 cm General: Well-developed, comfortable and in no apparent distress.  Neurological: Alert and oriented to person, place, and time.  Visual acuity intact. EOMI.  HEENT: Head/Face: Normocephalic without trauma. Face symmetric.  Neck soft, supple. Thyroid  nodule palpable on left, soft, nontender. Normal mandibular ROM with no deviations or restrictions. MIO >30 mm. 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 Dentition grossly intact, class II occlusion. Adequate oral hygiene. Mucosa pink and well hydrated. Floor of mouth soft. Good OH. Difficult to visualize posterior oropharynx secondary to superiorly displaced tongue from large lingual tori.  Cardiovascular: Regular rate and rhythm without murmurs, rubs, or gallops.  Pulmonary: Lungs clear to auscultation without wheezes, rales, or rhonchi. Normal work of breathing on room air.  Abdomen: Soft, non-tender, non-distended with bowel sounds present. No rebound or guarding. Extremities: Warm, well perfused. No cyanosis, clubbing or edema.  Pertinent Diagnostic Tests: Imaging: Panorex reveals dentition grossly intact. Impacted teeth #s 1 and 17 present. Implant in site #5. Multiple filled and crowned teeth. Condyles in fossae. No bony abnormality apparent.

## 2014-10-24 NOTE — Unmapped External Note (Signed)
 Oral and Maxillofacial Surgery Operative Report   Date of Surgery: 10/24/2014 Attending Physician: Evalene Farmer DDS. 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  Specimens:  - Right lingual mandibular torus sent to pathology - Left lingual mandibular torus sent to pathology  Drains: None  Cultures: None  Estimated Blood Loss: 10 mL  IV Fluids: 1100 mL  Urine Output: Not recorded mL  Dressings: None  Complications: None  Operative Findings: Hemostasis achieved at the end of the case.   Indications for Surgery: The patient is a 69 y.o. female who presents for preoperative evaluation in preparation for torus mandibularis removal. Due to the extensive nature of the surgery it would be best to complete the procedure under general anesthesia. Surgical risks and benefits were discussed with the patient, including but not limited to pain and swelling, bleeding, infection, temporary or permanent numbness, damage to adjacent teeth or structures and hematoma formation. All patient questions were answered. Patient has elected to proceed with procedure. Written and verbal consent obtained.  Procedure: The patient was identified in the precare holding area and medical history and informed consent were reviewed with the patient and family. All questions were answered. The patient was taken via stretcher to operating room 21 and placed in the supine position on the operating table. Standard lines and monitors were applied. The patient was preoxygenated and general anesthesia was induced via IV. The patient was intubated nasoendotracheally after several attempts  and with use of glide-scope and C blade. The tube was secured by the Oral and Maxillofacial Surgery team. Bilateral breath sounds and end tidal CO2 were noted. Eyes were taped with Tegaderm. The bed was turned 90 degrees. The left arm remained in the anesthesia care field and the right arm was tucked.  Extremities were evaluated and found to be in normal range of motion with all pressure points padded. The patient was prepped and draped using sterile technique in a usual Oral and Maxillofacial Surgery manner. A throat pack was placed and noted. Local anesthetic was injected in anterior mandible bilaterally. A second timeout was made.   Lidocaine  was infiltrated over the right and left lingual mandibular tori.  A crestal incision was then made with a #15 blade on the lingual aspect of teeth from #20-#29 a lingual mucoperiosteal flap was raised.  A Woodson elevator was used to gently dissect in a subperiosteal plane to expose the entire anterior bilateral mandibular tori.  A 702 bur and a high speed drill under copious NS was used to make an osteotomy to separate the left and then the right lingual tori from the mandible. Then a fiber handle and mallet was used to separate both tori from the mandible, while a seldin was protecting the floor of the mouth. A round bur under saline irrigation was used to remove the remains of the lingual torus and smooth the medial mandibular alveolar ridge.  The site was palpated and found to be smooth and uniform.  The flap was inspected with periosteal perforations or bleeds.  Wound irrigated with copious NS.  Flap reapproximated with 3-0 interrupted chromic sutures.  Hemostasis obtained.   The oral cavity and oropharynx were irrigated with copious amounts of normal saline and the oropharynx was suctioned. The throat pack was removed  under direct visualization and the oropharynx was suctioned and nasogastric tube was placed and the stomach contents were removed. Marcaine was  injected locally bilaterally. The orogastric tube was then removed. Prep and drapes were removed. The patient was cleansed and dried and turned back to the Anesthesia Care team where he was awakened without event and transferred to the PACU by the Anesthesia Care team for postoperative recovery.  Postoperative Care Plan: Patient to be discharged to home once anesthesia discharge criteria have been met.  Teaching Surgeon Attestation: Dr Jake was present and scrubbed for the entire procedure.

## 2014-10-24 NOTE — Interval H&P Note (Signed)
 DAY OF SURGERY UPDATE     H&P reviewed. The patient was examined and there are no changes to the H&P.        SITE MARKING ATTESTATION     Site Marked: Yes        CONSENT FOR OPERATION OR PROCEDURE: PROVIDER CERTIFICATION     I hereby certify that the nature, purpose, benefits, usual and most frequent risks of, and alternatives to, the operation or procedure have been explained to the patient (or person authorized to sign for the patient) either by a physician or by the provider who is to perform the operation or procedure, that the patient has had an opportunity to ask questions, and that those questions have been answered. The patient or the patient's representative has been advised that selected tasks may be performed by assistants to the primary health care provider(s). I believe that the patient (or person authorized to sign for the patient) understands what has been explained, and has consented to the operation or procedure.

## 2015-02-04 ENCOUNTER — Other Ambulatory Visit: Payer: Self-pay

## 2015-05-08 ENCOUNTER — Ambulatory Visit
Admission: RE | Admit: 2015-05-08 | Discharge: 2015-05-08 | Disposition: A | Discharge status: Home / Self Care | Payer: Medicare Other | Source: Ambulatory Visit | Attending: Surgery | Admitting: Surgery

## 2015-05-08 DIAGNOSIS — E041 Nontoxic single thyroid nodule: Secondary | ICD-10-CM

## 2015-05-09 ENCOUNTER — Other Ambulatory Visit: Payer: Self-pay

## 2015-07-01 ENCOUNTER — Other Ambulatory Visit: Payer: Self-pay | Admitting: Obstetrics and Gynecology

## 2015-07-01 ENCOUNTER — Other Ambulatory Visit (HOSPITAL_COMMUNITY)
Admission: RE | Admit: 2015-07-01 | Discharge: 2015-07-01 | Disposition: A | Discharge status: Home / Self Care | Payer: Medicare Other | Source: Ambulatory Visit | Attending: Obstetrics and Gynecology | Admitting: Obstetrics and Gynecology

## 2015-07-01 DIAGNOSIS — Z124 Encounter for screening for malignant neoplasm of cervix: Secondary | ICD-10-CM | POA: Insufficient documentation

## 2015-07-02 LAB — CYTOLOGY - PAP

## 2015-08-07 ENCOUNTER — Encounter (HOSPITAL_COMMUNITY): Payer: Self-pay

## 2015-08-07 ENCOUNTER — Encounter (HOSPITAL_COMMUNITY)
Admission: RE | Admit: 2015-08-07 | Discharge: 2015-08-07 | Disposition: A | Discharge status: Home / Self Care | Payer: Medicare Other | Source: Ambulatory Visit | Attending: Obstetrics and Gynecology | Admitting: Obstetrics and Gynecology

## 2015-08-07 ENCOUNTER — Other Ambulatory Visit: Payer: Self-pay

## 2015-08-07 DIAGNOSIS — Z01818 Encounter for other preprocedural examination: Secondary | ICD-10-CM | POA: Insufficient documentation

## 2015-08-07 DIAGNOSIS — N950 Postmenopausal bleeding: Secondary | ICD-10-CM | POA: Diagnosis not present

## 2015-08-07 HISTORY — DX: Headache, unspecified: R51.9

## 2015-08-07 HISTORY — DX: Headache: R51

## 2015-08-07 HISTORY — DX: Hypothyroidism, unspecified: E03.9

## 2015-08-07 HISTORY — DX: Sleep apnea, unspecified: G47.30

## 2015-08-07 LAB — BASIC METABOLIC PANEL
Anion gap: 8 (ref 5–15)
BUN: 16 mg/dL (ref 6–20)
CHLORIDE: 103 mmol/L (ref 101–111)
CO2: 29 mmol/L (ref 22–32)
Calcium: 10 mg/dL (ref 8.9–10.3)
Creatinine, Ser: 0.91 mg/dL (ref 0.44–1.00)
GFR calc Af Amer: >60 mL/min (ref >60)
GFR calc non Af Amer: >60 mL/min (ref >60)
GLUCOSE: 95 mg/dL (ref 65–99)
POTASSIUM: 3.8 mmol/L (ref 3.5–5.1)
Sodium: 140 mmol/L (ref 135–145)

## 2015-08-07 LAB — CBC
HCT: 44.1 % (ref 36.0–46.0)
HEMOGLOBIN: 14.9 g/dL (ref 12.0–15.0)
MCH: 30.8 pg (ref 26.0–34.0)
MCHC: 33.8 g/dL (ref 30.0–36.0)
MCV: 91.1 fL (ref 78.0–100.0)
Platelets: 223 10*3/uL (ref 150–400)
RBC: 4.84 MIL/uL (ref 3.87–5.11)
RDW: 13.1 % (ref 11.5–15.5)
WBC: 6.5 10*3/uL (ref 4.0–10.5)

## 2015-08-07 NOTE — Patient Instructions (Signed)
Your procedure is scheduled on:  Thursday, Jan. 5, 2016  Enter through the [employer] [facility] at:  7:30 A.M.  Pick up the phone at the desk and dial [phone].  Call this number if you have problems the morning of surgery: [phone].  Remember: Do NOT eat food or drink after:  Midnight Wednesday, Jan. 4, 2016 Take these medicines the morning of surgery with a SIP OF WATER:  Hydrochlorothiazide, Levothyroxine, Lisinopril  Do NOT wear jewelry (body piercing), metal hair clips/bobby pins, make-up, or nail polish. Do NOT wear lotions, powders, or perfumes.  You may wear deoderant. Do NOT shave for 48 hours prior to surgery. Do NOT bring valuables to the hospital. Contacts, dentures, or bridgework may not be worn into surgery.  Have a responsible adult drive you home and stay with you for 24 hours after your procedure

## 2015-08-14 NOTE — H&P (Signed)
History of Present Illness  General:  Pt presents for pelvic ultrasound due to PMB. Pt is currently on HRT due to severe hot flashes and disturbance in mood. Has not tried Brisdelle. Ultrasound with EMS greater than 4 mm. Pt agreeable to D&C.   Current Medications  Taking   Nasalcrom(Cromolyn Sodium) 5.2 MG/ACT Aerosol Solution 1 puff in each nostril as needed every 4 hrs   Cetirizine HCl 10 MG Tablet 1 tablet as needed Once a day   Estrace(Estradiol) 1 MG Tablet 1 tablet Once daily   Vitamin D3 50000 UNIT Capsule 1 capsule once weekly   Synthroid(L-Thyroxine Sodium) 88 MCG Tablet 1 tablet every morning on an empty stomach Once a day   HCTZ 25 MG 25mg  Tablet 1 tablet once daily   Lisinopril 5 MG Tablet 1 tablet Once a day   Aspirin 81 MG Tablet Chewable 1 tablet Once a day   Provera(Medroxyprogesterone) 2.5 MG Tablet 1 tablet Once a day   Not-Taking/PRN   Caltrate 600+D(Calcium-Vitamin D) 600-800 MG-UNIT Tablet 1 tablet Twice a day   Melatonin 5 MG Tablet 1 tab with food at bedtime   DHA 450 Capsule 1 capsule with a meal twice a day    Past Medical History  Low back pain  Dysplastic nevus syndrome  Hypertension  Iron deficiency anemia (1994, ? d/t menorrhagia)  ocular migraines (wavy lines in one VF x10 min with no HA; repetitive; infrequent)  thyroid nodule (followed by Dr. Harlow Asa; getting smaller last check)  Insomnia (on amitriptyline in past; now on melatonin)  BCE left side of nose - Dr. Delman Cheadle  Obstructive sleep apnea (PSG 11/15/10, ESS 8, AHI 11/hr REM 21/hr, RDI 20/hr REM 34/hr, O2 nadir 79% -transient; on APAP 7-15)  GYN - Dr. Simona Huh  right retinal tear (2012) - Dr. Baird Cancer   normal BMD - 2012  postmenopausal hot flashes on HRT - attempted to stop in 2014 without success - Dr. Simona Huh  left macular pucker - Dr. Baird Cancer  Essential hypertension, benign  Nontoxic uninodular goiter  Obstructive sleep apnea   Surgical History  appendectomy   tonsillectomy   tubal  ligation   tear duct surgery   Moh's chemosurgery left face 2011  laser surgery for retinal tear 2012  removal of Tori in mouth 10/2014   Family History  Father: deceased, died in accident  Mother: unknown, has not seen mother in over 73 years  Brother 1: alive, A + W  Sister 1: alive, A + W  denies any GYN family cancer hx.   Social History  General:  Tobacco use  cigarettes: Never smoked Tobacco history last updated 07/15/2015 Additional Findings: Tobacco Non-User Non-smoker for personal reasons no EXPOSURE TO PASSIVE SMOKE.  Alcohol: occasionally.  no Caffeine.  no Recreational drug use.  Exercise: active lifestyle.  Marital Status: married.  Children: Boys, 1, girls, 1.  OCCUPATION: pharmacist retired.    Gyn History  Sexual activity currently sexually active some.  Periods : postmenopausal.  LMP did have some spotting recently.  Birth control BTL.  Last pap smear date 06/29/14, neagtive.  Last mammogram date 12/15.  Denies H/O Abnormal pap smear.  Denies H/O STD.  Menarche 35.  GYN procedures Endometrial Biopsy in 1/08, benign.  Denies H/O Genetic history.  Denies H/O Trying to get pregnant.    OB History  Number of pregnancies 3.  Pregnancy # 1 live birth, vaginal delivery, girl.  Pregnancy # 2 live birth, vaginal delivery, boy.    Allergies  N.K.D.A.   Hospitalization/Major Diagnostic Procedure  Not in the last year 2015   Vital Signs  Wt 141, Ht 62.25, BMI 25.58, Pulse sitting 64, BP sitting 121/71.   Physical Examination  GENERAL:  Patient appears alert and oriented.  General Appearance: well-appearing, well-developed, no acute distress.  Speech: clear.     Assessments   1. Postmenopausal bleeding - N95.0 (Primary)   2. Hot flashes, menopausal - N95.1   Treatment  1. Postmenopausal bleeding  Imaging: US ECHO TRANSVAGINAL   2. Hot flashes, menopausal  Start Brisdelle Capsule, 7.5mg , 1 capsule, by mouth, once daily at bedtime, 30 days,  30, Refills 3  Referral [id number] [facility] Reason:Precert for Hyst/D&C, possible Myosure-Pt does not have preference for date or time.Needs preop clearance    Procedure Codes  76830 ECHO TRANSVAGINAL   Follow Up  prn pre op

## 2015-08-15 ENCOUNTER — Encounter (HOSPITAL_COMMUNITY): Payer: Self-pay | Admitting: Anesthesiology

## 2015-08-15 ENCOUNTER — Ambulatory Visit (HOSPITAL_COMMUNITY)
Admission: RE | Admit: 2015-08-15 | Discharge: 2015-08-15 | Disposition: A | Discharge status: Home / Self Care | Payer: Medicare Other | Source: Ambulatory Visit | Attending: Obstetrics and Gynecology | Admitting: Obstetrics and Gynecology

## 2015-08-15 ENCOUNTER — Ambulatory Visit (HOSPITAL_COMMUNITY): Payer: Medicare Other | Admitting: Anesthesiology

## 2015-08-15 ENCOUNTER — Encounter (HOSPITAL_COMMUNITY)
Admission: RE | Disposition: A | Discharge status: Home / Self Care | Payer: Self-pay | Source: Ambulatory Visit | Attending: Obstetrics and Gynecology

## 2015-08-15 DIAGNOSIS — Z7982 Long term (current) use of aspirin: Secondary | ICD-10-CM | POA: Insufficient documentation

## 2015-08-15 DIAGNOSIS — N950 Postmenopausal bleeding: Secondary | ICD-10-CM | POA: Diagnosis present

## 2015-08-15 DIAGNOSIS — D250 Submucous leiomyoma of uterus: Secondary | ICD-10-CM | POA: Diagnosis not present

## 2015-08-15 DIAGNOSIS — I10 Essential (primary) hypertension: Secondary | ICD-10-CM | POA: Insufficient documentation

## 2015-08-15 DIAGNOSIS — N951 Menopausal and female climacteric states: Secondary | ICD-10-CM | POA: Insufficient documentation

## 2015-08-15 DIAGNOSIS — N840 Polyp of corpus uteri: Secondary | ICD-10-CM | POA: Insufficient documentation

## 2015-08-15 HISTORY — PX: DILATATION & CURETTAGE/HYSTEROSCOPY WITH MYOSURE: SHX6511

## 2015-08-15 SURGERY — DILATATION & CURETTAGE/HYSTEROSCOPY WITH MYOSURE
Anesthesia: General | Site: Vagina

## 2015-08-15 MED ORDER — ACETAMINOPHEN 160 MG/5ML PO SOLN
ORAL | Status: AC
  Administered 2015-08-15: 975 mg via ORAL
  Filled 2015-08-15: qty 40.6

## 2015-08-15 MED ORDER — DEXAMETHASONE SODIUM PHOSPHATE 10 MG/ML IJ SOLN
INTRAMUSCULAR | Status: DC | PRN
  Administered 2015-08-15: 4 mg via INTRAVENOUS

## 2015-08-15 MED ORDER — FENTANYL CITRATE (PF) 100 MCG/2ML IJ SOLN: 25.0000 ug | INTRAMUSCULAR | Status: DC | PRN

## 2015-08-15 MED ORDER — KETOROLAC TROMETHAMINE 30 MG/ML IJ SOLN
INTRAMUSCULAR | Status: AC
  Filled 2015-08-15: qty 1

## 2015-08-15 MED ORDER — ACETAMINOPHEN 160 MG/5ML PO SOLN
650.0000 mg | Freq: Once | ORAL | Status: AC
  Administered 2015-08-15: 975 mg via ORAL

## 2015-08-15 MED ORDER — ONDANSETRON HCL 4 MG/2ML IJ SOLN
INTRAMUSCULAR | Status: DC | PRN
  Administered 2015-08-15: 4 mg via INTRAVENOUS

## 2015-08-15 MED ORDER — PROPOFOL 10 MG/ML IV BOLUS
INTRAVENOUS | Status: AC
Start: 2015-08-15 — End: 2015-08-15
  Filled 2015-08-15: qty 20

## 2015-08-15 MED ORDER — LIDOCAINE HCL (CARDIAC) 20 MG/ML IV SOLN
INTRAVENOUS | Status: DC | PRN
  Administered 2015-08-15: 60 mg via INTRAVENOUS

## 2015-08-15 MED ORDER — LIDOCAINE HCL 2 % IJ SOLN
INTRAMUSCULAR | Status: DC | PRN
  Administered 2015-08-15: 8 mL

## 2015-08-15 MED ORDER — ONDANSETRON HCL 4 MG/2ML IJ SOLN
INTRAMUSCULAR | Status: AC
  Filled 2015-08-15: qty 2

## 2015-08-15 MED ORDER — SODIUM CHLORIDE 0.9 % IR SOLN
Status: DC | PRN
  Administered 2015-08-15: 3000 mL

## 2015-08-15 MED ORDER — FENTANYL CITRATE (PF) 250 MCG/5ML IJ SOLN
INTRAMUSCULAR | Status: AC
  Filled 2015-08-15: qty 5

## 2015-08-15 MED ORDER — LACTATED RINGERS IV SOLN
INTRAVENOUS | Status: DC
  Administered 2015-08-15 (×2): via INTRAVENOUS

## 2015-08-15 MED ORDER — EPHEDRINE 5 MG/ML INJ
INTRAVENOUS | Status: AC
  Filled 2015-08-15: qty 10

## 2015-08-15 MED ORDER — LIDOCAINE HCL (CARDIAC) 20 MG/ML IV SOLN
INTRAVENOUS | Status: AC
  Filled 2015-08-15: qty 5

## 2015-08-15 MED ORDER — DEXAMETHASONE SODIUM PHOSPHATE 4 MG/ML IJ SOLN
INTRAMUSCULAR | Status: AC
Start: 2015-08-15 — End: 2015-08-15
  Filled 2015-08-15: qty 1

## 2015-08-15 MED ORDER — IBUPROFEN 600 MG PO TABS: 600.0000 mg | ORAL_TABLET | Freq: Four times a day (QID) | ORAL | Status: AC | PRN

## 2015-08-15 MED ORDER — PROPOFOL 10 MG/ML IV BOLUS
INTRAVENOUS | Status: DC | PRN
  Administered 2015-08-15: 120 mg via INTRAVENOUS

## 2015-08-15 MED ORDER — SCOPOLAMINE 1 MG/3DAYS TD PT72: 1.0000 | MEDICATED_PATCH | Freq: Once | TRANSDERMAL | Status: DC

## 2015-08-15 MED ORDER — EPHEDRINE SULFATE 50 MG/ML IJ SOLN
INTRAMUSCULAR | Status: DC | PRN
  Administered 2015-08-15: 10 mg via INTRAVENOUS

## 2015-08-15 MED ORDER — SILVER NITRATE-POT NITRATE 75-25 % EX MISC
CUTANEOUS | Status: AC
  Filled 2015-08-15: qty 1

## 2015-08-15 MED ORDER — PHENYLEPHRINE 40 MCG/ML (10ML) SYRINGE FOR IV PUSH (FOR BLOOD PRESSURE SUPPORT)
PREFILLED_SYRINGE | INTRAVENOUS | Status: AC
  Filled 2015-08-15: qty 10

## 2015-08-15 MED ORDER — LIDOCAINE HCL 2 % IJ SOLN
INTRAMUSCULAR | Status: AC
  Filled 2015-08-15: qty 20

## 2015-08-15 MED ORDER — FENTANYL CITRATE (PF) 100 MCG/2ML IJ SOLN
INTRAMUSCULAR | Status: DC | PRN
  Administered 2015-08-15: 50 ug via INTRAVENOUS
  Administered 2015-08-15: 25 ug via INTRAVENOUS

## 2015-08-15 MED ORDER — PHENYLEPHRINE HCL 10 MG/ML IJ SOLN
INTRAMUSCULAR | Status: DC | PRN
  Administered 2015-08-15 (×2): 40 ug via INTRAVENOUS

## 2015-08-15 MED ORDER — KETOROLAC TROMETHAMINE 15 MG/ML IJ SOLN
INTRAMUSCULAR | Status: DC | PRN
  Administered 2015-08-15: 15 mg via INTRAVENOUS

## 2015-08-15 SURGICAL SUPPLY — 16 items
CANISTER SUCT 3000ML (MISCELLANEOUS) ×4 IMPLANT
CATH ROBINSON RED A/P 16FR (CATHETERS) ×4 IMPLANT
CLOTH BEACON ORANGE TIMEOUT ST (SAFETY) ×4 IMPLANT
CONTAINER PREFILL 10% NBF 60ML (FORM) ×8 IMPLANT
DEVICE MYOSURE LITE (MISCELLANEOUS) ×3 IMPLANT
DILATOR CANAL MILEX (MISCELLANEOUS) IMPLANT
GLOVE BIO SURGEON STRL SZ7 (GLOVE) ×4 IMPLANT
GLOVE BIOGEL PI IND STRL 7.0 (GLOVE) ×4 IMPLANT
GLOVE BIOGEL PI INDICATOR 7.0 (GLOVE) ×4
GOWN STRL REUS W/TWL LRG LVL3 (GOWN DISPOSABLE) ×8 IMPLANT
PACK VAGINAL MINOR WOMEN LF (CUSTOM PROCEDURE TRAY) ×4 IMPLANT
PAD OB MATERNITY 4.3X12.25 (PERSONAL CARE ITEMS) ×4 IMPLANT
TOWEL OR 17X24 6PK STRL BLUE (TOWEL DISPOSABLE) ×8 IMPLANT
TUBING AQUILEX INFLOW (TUBING) ×4 IMPLANT
TUBING AQUILEX OUTFLOW (TUBING) ×4 IMPLANT
WATER STERILE IRR 1000ML POUR (IV SOLUTION) ×4 IMPLANT

## 2015-08-15 NOTE — Discharge Instructions (Signed)

## 2015-08-15 NOTE — Anesthesia Preprocedure Evaluation (Addendum)
Anesthesia Evaluation  Patient identified by MRN, date of birth, ID band Patient awake    Reviewed: Allergy & Precautions, H&P , Patient's Chart, lab work & pertinent test results, reviewed documented beta blocker date and time   Airway Mallampati: II  TM Distance: >3 FB Neck ROM: full    Dental no notable dental hx.    Pulmonary    Pulmonary exam normal breath sounds clear to auscultation       Cardiovascular hypertension, On Medications  Rhythm:regular Rate:Normal     Neuro/Psych    GI/Hepatic   Endo/Other  Hypothyroidism   Renal/GU      Musculoskeletal   Abdominal   Peds  Hematology   Anesthesia Other Findings Hypertension .... well controlled Sleep apnea .....  Uses CPAP mask, compliant   Hypothyroidism   Headache         Reproductive/Obstetrics                           Anesthesia Physical Anesthesia Plan  ASA: II  Anesthesia Plan:    Post-op Pain Management:    Induction: Intravenous  Airway Management Planned: LMA  Additional Equipment:   Intra-op Plan:   Post-operative Plan:   Informed Consent: I have reviewed the patients History and Physical, chart, labs and discussed the procedure including the risks, benefits and alternatives for the proposed anesthesia with the patient or authorized representative who has indicated his/her understanding and acceptance.   Dental Advisory Given and Dental advisory given  Plan Discussed with: CRNA and Surgeon  Anesthesia Plan Comments: (Discussed GA with LMA, possible sore throat, potential need to switch to ETT, N/V, pulmonary aspiration. Questions answered. )        Anesthesia Quick Evaluation

## 2015-08-15 NOTE — Anesthesia Postprocedure Evaluation (Signed)
Anesthesia Post Note  Patient: Jeanne Hill  Procedure(s) Performed: Procedure(s) (LRB): DILATATION & CURETTAGE/HYSTEROSCOPY WITH MYOSURE (N/A)  Patient location during evaluation: PACU Anesthesia Type: General Level of consciousness: sedated Pain management: satisfactory to patient Vital Signs Assessment: post-procedure vital signs reviewed and stable Respiratory status: spontaneous breathing Cardiovascular status: stable Anesthetic complications: no    Last Vitals:  Filed Vitals:   08/15/15 1015 08/15/15 1030  BP: 103/47 101/53  Pulse: 72 72  Temp:    Resp: 19 11    Last Pain: There were no vitals filed for this visit.               Riccardo Dubin

## 2015-08-15 NOTE — Anesthesia Procedure Notes (Signed)
Procedure Name: LMA Insertion Date/Time: 08/15/2015 9:15 AM Performed by: Georgeanne Nim Pre-anesthesia Checklist: Patient identified, Emergency Drugs available, Suction available, Patient being monitored and Timeout performed Patient Re-evaluated:Patient Re-evaluated prior to inductionOxygen Delivery Method: Circle system utilized Preoxygenation: Pre-oxygenation with 100% oxygen Intubation Type: IV induction Ventilation: Mask ventilation without difficulty LMA: LMA inserted LMA Size: 4.0 Number of attempts: 1 Placement Confirmation: positive ETCO2,  CO2 detector and breath sounds checked- equal and bilateral Tube secured with: Tape Dental Injury: Teeth and Oropharynx as per pre-operative assessment

## 2015-08-15 NOTE — Op Note (Signed)
NAMEKANDACE, RIRIE             ACCOUNT NO.:  [id number]  MEDICAL RECORD NO.:  [id number]  LOCATION:  [facility]                          FACILITY:  [facility]  PHYSICIAN:  Jola Schmidt, MD   DATE OF BIRTH:  [birth year]  DATE OF PROCEDURE:  08/15/2015 DATE OF DISCHARGE:                              OPERATIVE REPORT   PREOPERATIVE DIAGNOSES:  Postmenopausal bleeding and endometrial mass.  POSTOPERATIVE DIAGNOSIS:  Submucosal fibroid and endometrial polyp.  PROCEDURES:  Hysteroscopy, D and C with resection of fibroid and polyp via MyoSure.  SURGEON:  Jola Schmidt, MD  ASSISTANT:  None.  ANESTHESIA:  General (LMA).  EBL:  Minimal.  BLOOD ADMINISTERED:  None.  DRAINS:  None.  LOCAL:  Lidocaine, 8 mL.  SPECIMENS:  Endometrial polyp and fibroid and endometrial curettings.  SPECIMENS:  To Pathology.  COMPLICATIONS:  None.  DISPOSITION:  To PACU, hemodynamically stable.  FINDINGS:  Submucosal fibroid anteriorly, fundal polyp and hyperemic endometriosis and other possible submucosal fibroids.  PROCEDURE IN DETAIL:  The patient was identified in the holding area. She was then taken to the operating room with IV running.  She underwent general endotracheal anesthesia without complication.  She was then placed in the dorsal lithotomy position, prepped and draped in normal sterile fashion.  A time-out was performed.  SCDs were on and operating.  I initially fitted a Graves speculum, but due to atrophy and the caliber of the vagina, I used a Peterson.  Peterson speculum was then used to visualize the cervix and paracervical block was performed.  The cervix was grasped at the anterior lip of the cervix with a single-tooth tenaculum.  Cervix was then dilated up to a Hegar 7.  The MyoSure hysteroscope was then advanced and the findings above were noted.  The polyp blade was then advanced through the hysteroscope and the masses were easily resected.  A general curettage was  performed of the endometrial cavity.  There were some other areas that were likely calcified fibroids.  After the MyoSure was removed from the uterus, a sharp curettage was performed of the entire uterus and those curettings were collected and sent to Pathology.  The single-tooth tenaculum was removed from the cervix.  The tenaculum site was hemostatic.  The patient did have some minor bruising on the inside of the vagina due to atrophy, but there were no tears or fissures.  All instruments, sponge and needle counts were correct x3.  The patient went to the recovery room in stable condition.     Jola Schmidt, MD     EBV/MEDQ  D:  08/15/2015  T:  08/15/2015  Job:  [id number]

## 2015-08-15 NOTE — Interval H&P Note (Signed)
History and Physical Interval Note:  08/15/2015 8:58 AM  Jeanne Hill  has presented today for surgery, with the diagnosis of N95.0 PMB  The various methods of treatment have been discussed with the patient and family. After consideration of risks, benefits and other options for treatment, the patient has consented to  Procedure(s) with comments: DILATATION AND CURETTAGE /HYSTEROSCOPY (N/A) - Possible Myosure for Fibroids as a surgical intervention .  The patient's history has been reviewed, patient examined, no change in status, stable for surgery.  I have reviewed the patient's chart and labs.  Questions were answered to the patient's satisfaction.     Simona Huh, Shyla Gayheart

## 2015-08-15 NOTE — Transfer of Care (Signed)
Immediate Anesthesia Transfer of Care Note  Patient: Jeanne Hill  Procedure(s) Performed: Procedure(s): DILATATION & CURETTAGE/HYSTEROSCOPY WITH MYOSURE (N/A)  Patient Location: PACU  Anesthesia Type:General  Level of Consciousness: awake, alert , oriented and patient cooperative  Airway & Oxygen Therapy: Patient Spontanous Breathing and Patient connected to nasal cannula oxygen  Post-op Assessment: Report given to RN and Post -op Vital signs reviewed and stable  Post vital signs: Reviewed and stable  Last Vitals:  Filed Vitals:   08/15/15 0729  BP: 139/58  Pulse: 69  Temp: 36.2 C  Resp: 20    Complications: No apparent anesthesia complications

## 2015-08-15 NOTE — Brief Op Note (Signed)
08/15/2015  9:57 AM  PATIENT:  Jeanne Hill  70 y.o. female  PRE-OPERATIVE DIAGNOSIS:  N95.0 Post Menopausal Bleeding, endometrial mass  POST-OPERATIVE DIAGNOSIS:  Same, submucosal fibroid endometrial polyp  PROCEDURE:  Procedure(s): DILATATION & CURETTAGE/HYSTEROSCOPY WITH MYOSURE (N/A)  SURGEON:  Surgeon(s) and Role:    * Thurnell Lose, MD - Primary  PHYSICIAN ASSISTANT:   ASSISTANTS: none   ANESTHESIA:   general  EBL:  Total I/O In: 1000 [I.V.:1000] Out: -   BLOOD ADMINISTERED:none  DRAINS: none   LOCAL MEDICATIONS USED:  LIDOCAINE  and Amount: 8 ml  SPECIMEN:  Source of Specimen:  Endometrial polyp and fibroid, endometrial currettings  DISPOSITION OF SPECIMEN:  PATHOLOGY  COUNTS:  YES  TOURNIQUET:  * No tourniquets in log *  DICTATION: .Other Dictation: Dictation Number [id number]  PLAN OF CARE: Discharge to home after PACU  PATIENT DISPOSITION:  PACU - hemodynamically stable.   Delay start of Pharmacological VTE agent (>24hrs) due to surgical blood loss or risk of bleeding: yes

## 2015-08-16 ENCOUNTER — Encounter (HOSPITAL_COMMUNITY): Payer: Self-pay | Admitting: Obstetrics and Gynecology

## 2016-07-09 ENCOUNTER — Telehealth: Payer: Self-pay | Admitting: Internal Medicine

## 2016-07-09 NOTE — Telephone Encounter (Signed)
[facility] physicians calling to get [place] report on this pt, the pt told them she had been seen here before. Upon review of the chart it was found she has never been here before. [facility] representative is aware and is contacting the pt back

## 2017-04-23 ENCOUNTER — Other Ambulatory Visit: Payer: Self-pay | Admitting: Surgery

## 2017-04-23 DIAGNOSIS — E042 Nontoxic multinodular goiter: Secondary | ICD-10-CM

## 2017-05-03 ENCOUNTER — Ambulatory Visit
Admission: RE | Admit: 2017-05-03 | Discharge: 2017-05-03 | Disposition: A | Discharge status: Home / Self Care | Payer: Medicare Other | Source: Ambulatory Visit | Attending: Surgery | Admitting: Surgery

## 2017-05-03 DIAGNOSIS — E042 Nontoxic multinodular goiter: Secondary | ICD-10-CM

## 2018-01-07 IMAGING — US US THYROID
1 series · 13 of 25 positions shown · non-contrast
Comparison: 05/08/2015;

CLINICAL DATA: Prior ultrasound follow-up. History of multiple
thyroid nodules. History of left-sided thyroid nodule fine-needle
aspiration (07/30/2006)

EXAM:
THYROID ULTRASOUND
TECHNIQUE: Ultrasound examination of the thyroid gland and adjacent soft
tissues was performed.

[Series 1: us thyroid · 0.08mm/px · 13 of 49 slices shown]
[im 1/49]
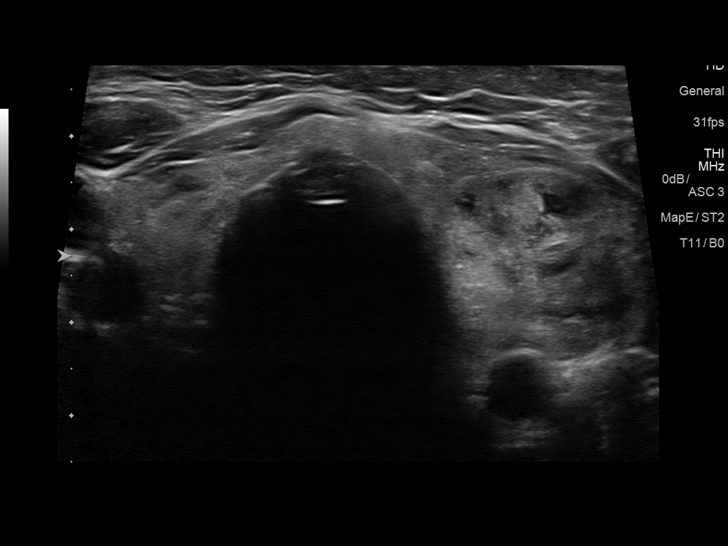
[im 5/49]
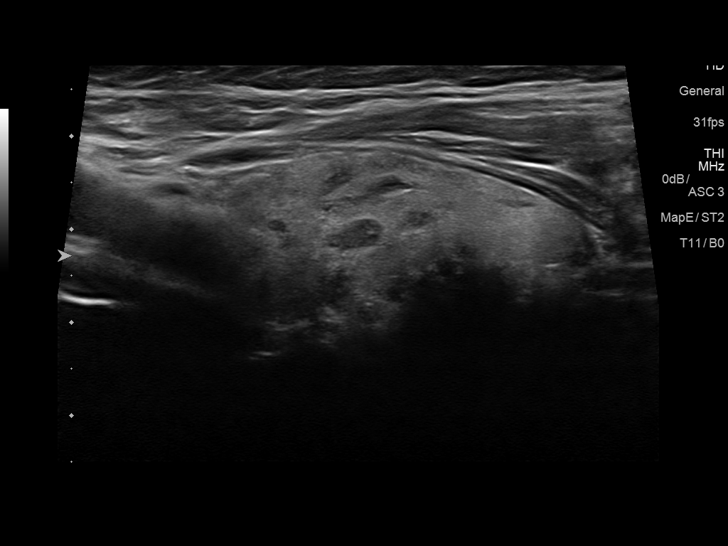
[im 9/49]
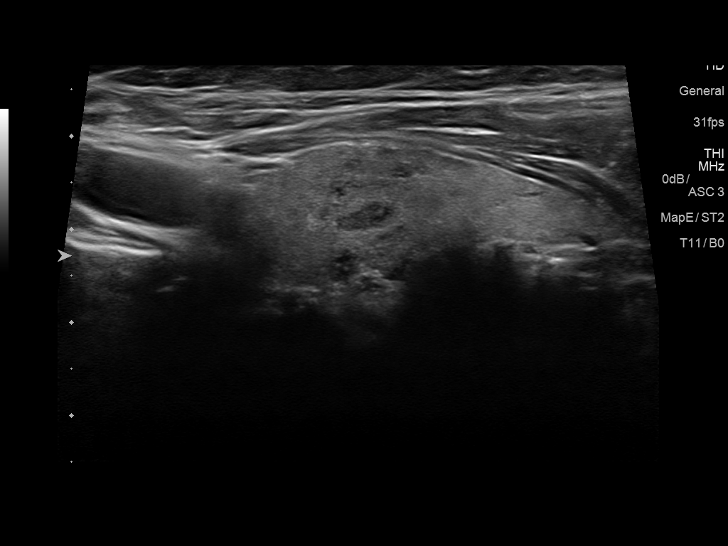
[im 13/49]
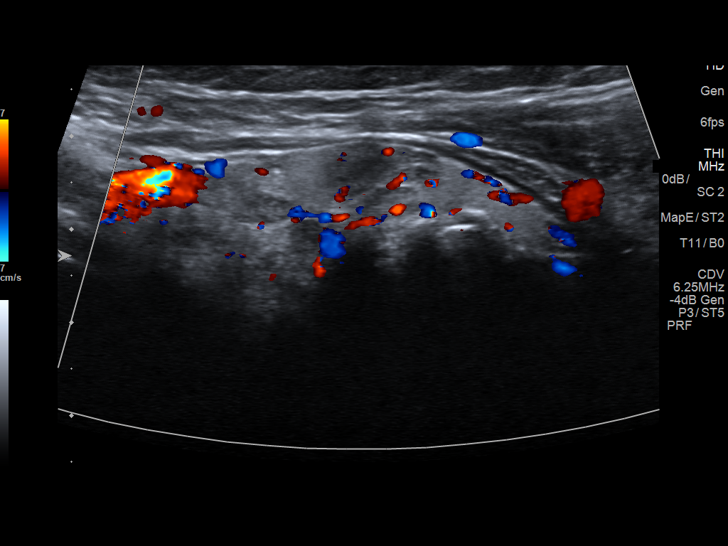
[im 17/49]
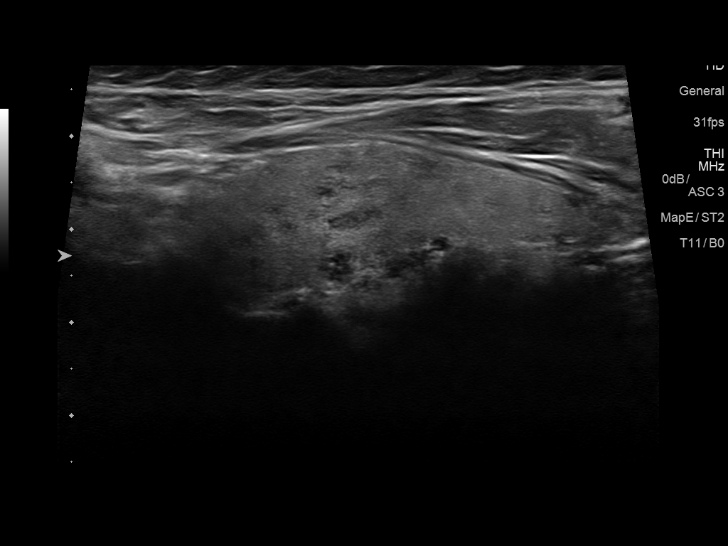
[im 21/49]
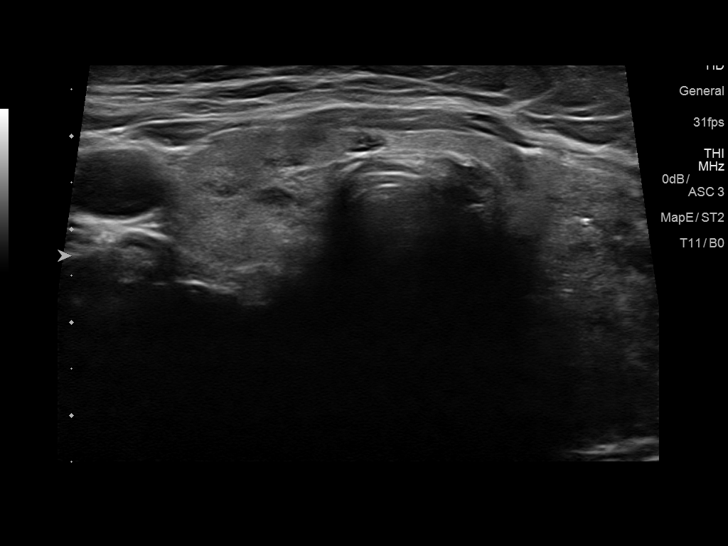
[im 25/49]
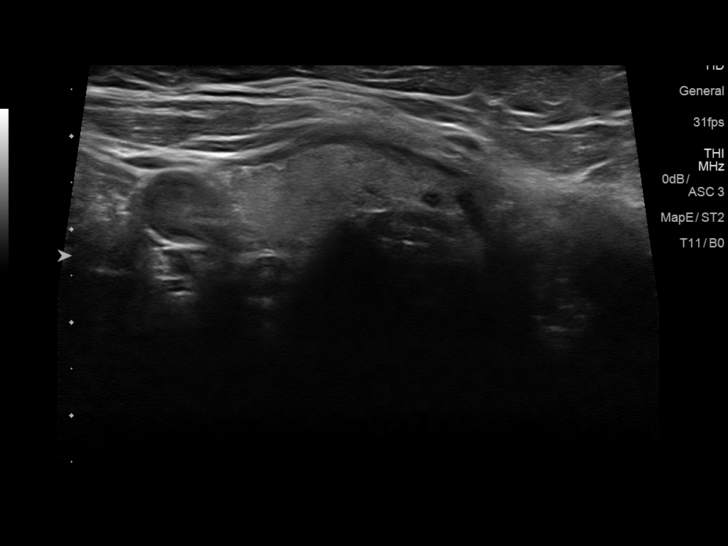
[im 29/49]
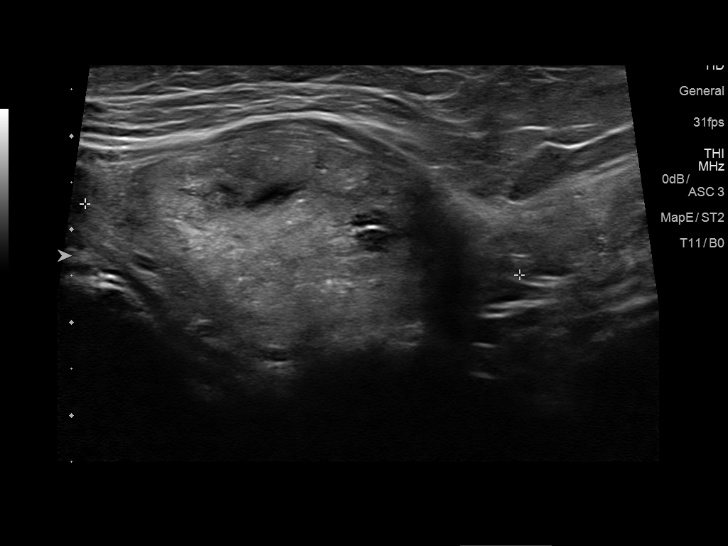
[im 33/49]
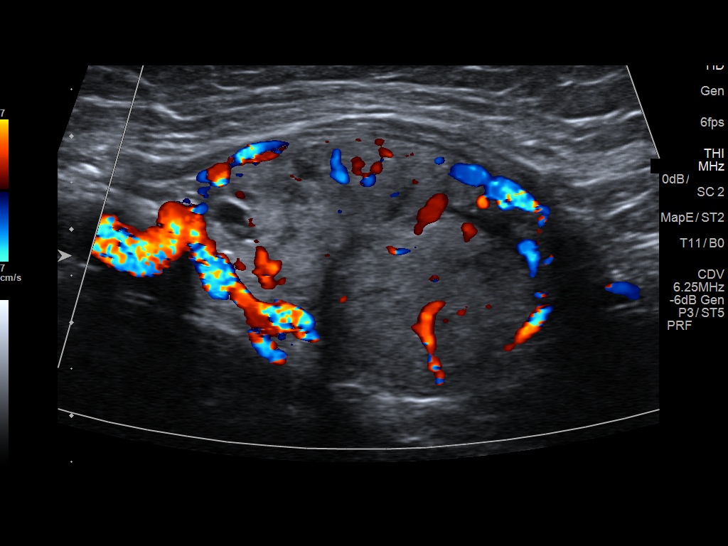
[im 37/49]
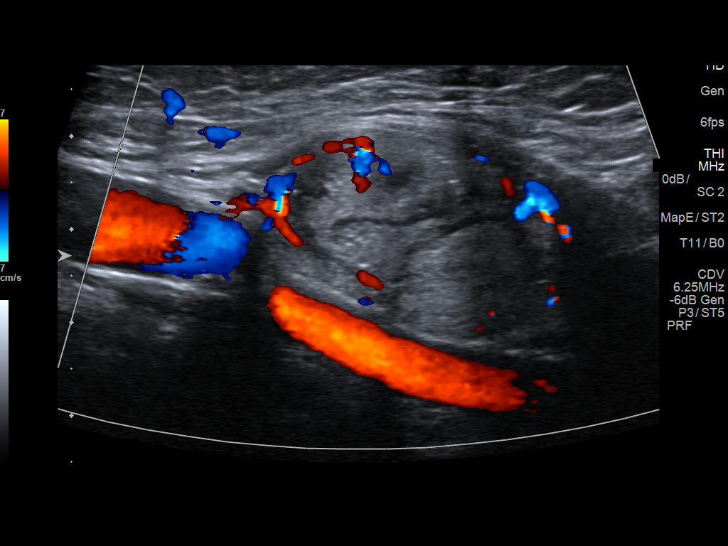
[im 41/49]
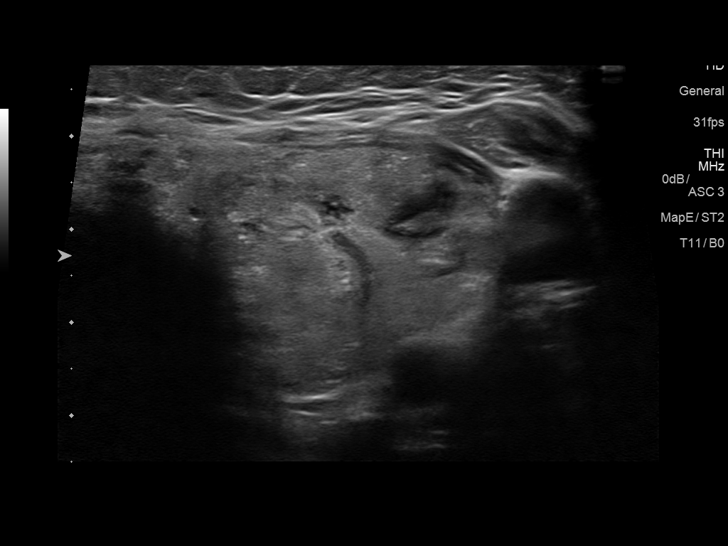
[im 45/49]
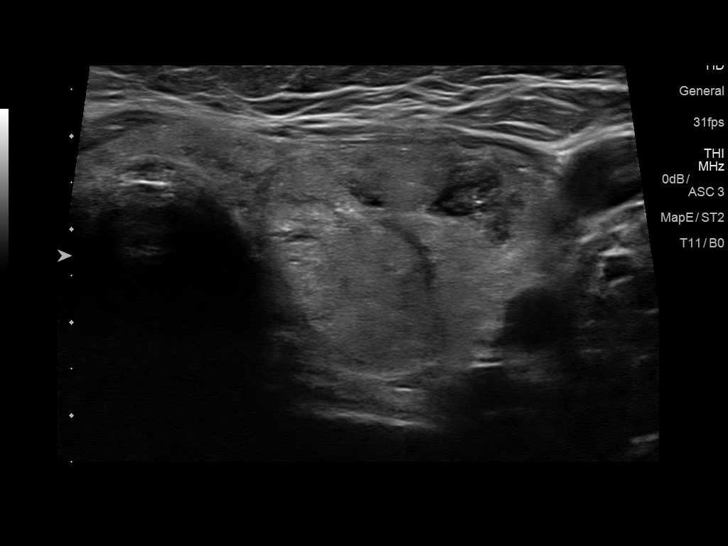
[im 49/49]
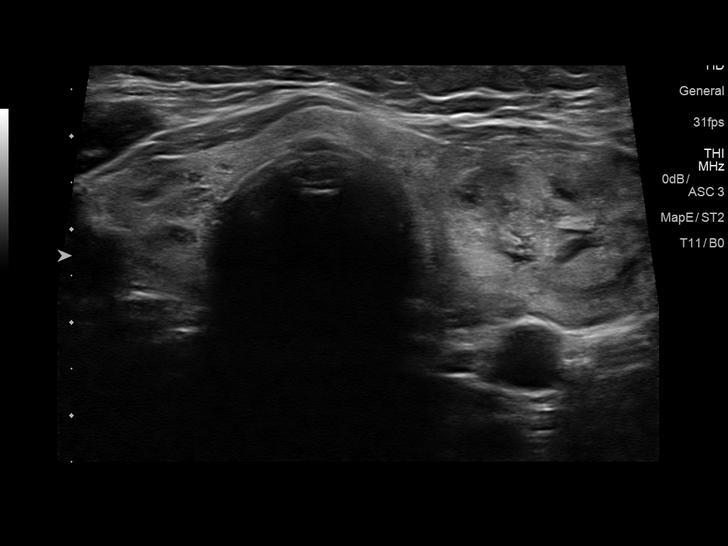

[13 of 25 positions shown; findings below may reference images not displayed]

05/23/2014 ; 04/21/2012; 06/23/2006;
ultrasound-guided left-sided thyroid nodule fine-needle aspiration
-07/30/2006
FINDINGS: Parenchymal Echotexture: Mildly heterogenous

Isthmus: Normal in size measures 0.5 cm in diameter, unchanged

Right lobe: Normal in size measuring 4.5 x 1.9 x 1.7 cm, unchanged,
previously, 4.7 x 1.5 x

Left lobe: Normal in size measuring 4.7 x 2.7 x 3.3 cm, unchanged,
previously, 4.8 x 2.3 x 3.0 cm

_________________________________________________________

Estimated total number of nodules >/= 1 cm: 2

Number of spongiform nodules >/=  2 cm not described below (TR1): 0

Number of mixed cystic and solid nodules >/= 1.5 cm not described
below (TR2): 0

_________________________________________________________

The approximately 1.3 x 0.7 x 1.0 cm hypoechoic nodule/pseudo nodule
within the anterior mid aspect the right lobe of the thyroid appears
similar to the [DATE] examination, previously, 1.8 x 0.6 x 0.8 cm
with slight differences likely attributable to scan plane
projection. Stability for greater than 5 years is indicative of a
benign etiology.

There several additional scattered punctate (sub 7 mm) nodules
scattered throughout the remainder of the right lobe of the thyroid,
none of which meet imaging criteria to recommend percutaneous
sampling or dedicated follow-up.

The previously biopsied approximately 3.9 x 2.7 x 3.3 cm nodule/mass
nearly replacing the entirety the left lobe of the thyroid appears
similar to the [DATE] examination, previously, 3.6 x 2.4 x 2.7 cm
with size differences likely attributable to a combination of
partial cystic degeneration and scan plane projection.
IMPRESSION: 1. Similar findings of multinodular goiter.
2. The previously biopsied approximately 3.9 cm nodule/mass nearly
replacing the entirety the left lobe of the thyroid appears similar
to remote thyroid ultrasound performed in [DATE], previously,
cm with slight differences likely attributable to interval partial
cystic degeneration and scan plane projection. Correlation with
prior biopsy results is recommended. Assuming a benign pathologic
diagnosis, repeat sampling and/or continued dedicated follow-up is
not recommended. Additionally, relative stability for 5 years is
suggestive of benign etiology.
3. An additional approximately 1.3 cm nodule/pseudo nodule within
the right lobe of the thyroid appears similar to the 1995
examination, previously, 1.8 cm. Stability for greater than 5 years
is suggestive of a benign etiology.
The above is in keeping with the ACR TI-RADS recommendations - [HOSPITAL] 8109;[DATE].

## 2019-09-24 ENCOUNTER — Ambulatory Visit: Payer: Medicare Other

## 2019-09-24 ENCOUNTER — Ambulatory Visit: Payer: Medicare Other | Attending: Internal Medicine

## 2019-09-24 DIAGNOSIS — Z23 Encounter for immunization: Secondary | ICD-10-CM | POA: Insufficient documentation

## 2019-09-24 NOTE — Progress Notes (Signed)
   Covid-19 Vaccination Clinic  Name:  Jeanne Hill    MRN: [id number] DOB: [birth year]  09/24/2019  Jeanne Hill was observed post Covid-19 immunization for 15 minutes without incidence. She was provided with Vaccine Information Sheet and instruction to access the V-Safe system.   Jeanne Hill was instructed to call 911 with any severe reactions post vaccine: Marland Kitchen Difficulty breathing  . Swelling of your face and throat  . A fast heartbeat  . A bad rash all over your body  . Dizziness and weakness    Immunizations Administered    Name Date Dose VIS Date Route   Pfizer COVID-19 Vaccine 09/24/2019 11:41 AM 0.3 mL 07/21/2019 Intramuscular   Manufacturer: [place]   Lot: [id number]   [place]: [id number]

## 2019-10-17 ENCOUNTER — Ambulatory Visit: Payer: Medicare Other | Attending: Internal Medicine

## 2019-10-17 DIAGNOSIS — Z23 Encounter for immunization: Secondary | ICD-10-CM | POA: Insufficient documentation

## 2019-10-17 NOTE — Progress Notes (Signed)
   Covid-19 Vaccination Clinic  Name:  MATLYN SCRUTON    MRN: [id number] DOB: 1946-06-29  10/17/2019  Ms. Haik was observed post Covid-19 immunization for 15 minutes without incident. She was provided with Vaccine Information Sheet and instruction to access the V-Safe system.   Ms. Bestor was instructed to call 911 with any severe reactions post vaccine: Marland Kitchen Difficulty breathing  . Swelling of face and throat  . A fast heartbeat  . A bad rash all over body  . Dizziness and weakness   Immunizations Administered    Name Date Dose VIS Date Route   Pfizer COVID-19 Vaccine 10/17/2019  2:51 PM 0.3 mL 07/21/2019 Intramuscular   Manufacturer: [place]   Lot: [id number]   [place]: [id number]

## 2020-08-21 DIAGNOSIS — G4733 Obstructive sleep apnea (adult) (pediatric): Secondary | ICD-10-CM | POA: Diagnosis not present

## 2020-08-21 DIAGNOSIS — I10 Essential (primary) hypertension: Secondary | ICD-10-CM | POA: Diagnosis not present

## 2020-08-23 DIAGNOSIS — D0462 Carcinoma in situ of skin of left upper limb, including shoulder: Secondary | ICD-10-CM | POA: Diagnosis not present

## 2020-09-01 DIAGNOSIS — Z20822 Contact with and (suspected) exposure to covid-19: Secondary | ICD-10-CM | POA: Diagnosis not present

## 2020-09-01 NOTE — Progress Notes (Signed)
  Self Swab Type: Anterior Nasal

## 2020-10-07 DIAGNOSIS — E7800 Pure hypercholesterolemia, unspecified: Secondary | ICD-10-CM | POA: Diagnosis not present

## 2020-10-07 DIAGNOSIS — H3581 Retinal edema: Secondary | ICD-10-CM | POA: Diagnosis not present

## 2020-10-07 DIAGNOSIS — I10 Essential (primary) hypertension: Secondary | ICD-10-CM | POA: Diagnosis not present

## 2020-10-07 DIAGNOSIS — H35371 Puckering of macula, right eye: Secondary | ICD-10-CM | POA: Diagnosis not present

## 2020-10-07 DIAGNOSIS — E039 Hypothyroidism, unspecified: Secondary | ICD-10-CM | POA: Diagnosis not present

## 2020-10-07 DIAGNOSIS — H26491 Other secondary cataract, right eye: Secondary | ICD-10-CM | POA: Diagnosis not present

## 2020-10-08 DIAGNOSIS — H35371 Puckering of macula, right eye: Secondary | ICD-10-CM | POA: Diagnosis not present

## 2020-10-15 DIAGNOSIS — H3581 Retinal edema: Secondary | ICD-10-CM | POA: Diagnosis not present

## 2020-10-15 DIAGNOSIS — H43812 Vitreous degeneration, left eye: Secondary | ICD-10-CM | POA: Diagnosis not present

## 2020-10-15 DIAGNOSIS — H35371 Puckering of macula, right eye: Secondary | ICD-10-CM | POA: Diagnosis not present

## 2020-10-17 DIAGNOSIS — D2272 Melanocytic nevi of left lower limb, including hip: Secondary | ICD-10-CM | POA: Diagnosis not present

## 2020-10-17 DIAGNOSIS — D225 Melanocytic nevi of trunk: Secondary | ICD-10-CM | POA: Diagnosis not present

## 2020-10-17 DIAGNOSIS — D2271 Melanocytic nevi of right lower limb, including hip: Secondary | ICD-10-CM | POA: Diagnosis not present

## 2020-10-17 DIAGNOSIS — L578 Other skin changes due to chronic exposure to nonionizing radiation: Secondary | ICD-10-CM | POA: Diagnosis not present

## 2020-10-17 DIAGNOSIS — L821 Other seborrheic keratosis: Secondary | ICD-10-CM | POA: Diagnosis not present

## 2020-11-05 DIAGNOSIS — H3581 Retinal edema: Secondary | ICD-10-CM | POA: Diagnosis not present

## 2020-11-05 DIAGNOSIS — H35371 Puckering of macula, right eye: Secondary | ICD-10-CM | POA: Diagnosis not present

## 2020-11-25 DIAGNOSIS — G4733 Obstructive sleep apnea (adult) (pediatric): Secondary | ICD-10-CM | POA: Diagnosis not present

## 2020-11-25 DIAGNOSIS — I10 Essential (primary) hypertension: Secondary | ICD-10-CM | POA: Diagnosis not present

## 2020-11-25 DIAGNOSIS — E7800 Pure hypercholesterolemia, unspecified: Secondary | ICD-10-CM | POA: Diagnosis not present

## 2020-12-03 DIAGNOSIS — E039 Hypothyroidism, unspecified: Secondary | ICD-10-CM | POA: Diagnosis not present

## 2020-12-03 DIAGNOSIS — E7800 Pure hypercholesterolemia, unspecified: Secondary | ICD-10-CM | POA: Diagnosis not present

## 2020-12-03 DIAGNOSIS — I10 Essential (primary) hypertension: Secondary | ICD-10-CM | POA: Diagnosis not present

## 2020-12-09 DIAGNOSIS — Z0000 Encounter for general adult medical examination without abnormal findings: Secondary | ICD-10-CM | POA: Diagnosis not present

## 2020-12-09 DIAGNOSIS — Z1389 Encounter for screening for other disorder: Secondary | ICD-10-CM | POA: Diagnosis not present

## 2021-02-18 DIAGNOSIS — E7800 Pure hypercholesterolemia, unspecified: Secondary | ICD-10-CM | POA: Diagnosis not present

## 2021-02-18 DIAGNOSIS — E039 Hypothyroidism, unspecified: Secondary | ICD-10-CM | POA: Diagnosis not present

## 2021-02-18 DIAGNOSIS — I10 Essential (primary) hypertension: Secondary | ICD-10-CM | POA: Diagnosis not present

## 2021-03-11 DIAGNOSIS — I10 Essential (primary) hypertension: Secondary | ICD-10-CM | POA: Diagnosis not present

## 2021-03-11 DIAGNOSIS — G4733 Obstructive sleep apnea (adult) (pediatric): Secondary | ICD-10-CM | POA: Diagnosis not present

## 2021-03-14 DIAGNOSIS — Z85828 Personal history of other malignant neoplasm of skin: Secondary | ICD-10-CM | POA: Diagnosis not present

## 2021-03-25 DIAGNOSIS — H35371 Puckering of macula, right eye: Secondary | ICD-10-CM | POA: Diagnosis not present

## 2021-04-08 DIAGNOSIS — G4733 Obstructive sleep apnea (adult) (pediatric): Secondary | ICD-10-CM | POA: Diagnosis not present

## 2021-05-06 DIAGNOSIS — H31091 Other chorioretinal scars, right eye: Secondary | ICD-10-CM | POA: Diagnosis not present

## 2021-05-07 DIAGNOSIS — E042 Nontoxic multinodular goiter: Secondary | ICD-10-CM | POA: Diagnosis not present

## 2021-05-07 DIAGNOSIS — E039 Hypothyroidism, unspecified: Secondary | ICD-10-CM | POA: Diagnosis not present

## 2021-05-07 DIAGNOSIS — Z0000 Encounter for general adult medical examination without abnormal findings: Secondary | ICD-10-CM | POA: Diagnosis not present

## 2021-05-07 DIAGNOSIS — E559 Vitamin D deficiency, unspecified: Secondary | ICD-10-CM | POA: Diagnosis not present

## 2021-05-07 DIAGNOSIS — E7800 Pure hypercholesterolemia, unspecified: Secondary | ICD-10-CM | POA: Diagnosis not present

## 2021-05-07 DIAGNOSIS — I10 Essential (primary) hypertension: Secondary | ICD-10-CM | POA: Diagnosis not present

## 2021-06-17 DIAGNOSIS — Z1231 Encounter for screening mammogram for malignant neoplasm of breast: Secondary | ICD-10-CM | POA: Diagnosis not present

## 2021-07-01 DIAGNOSIS — E7800 Pure hypercholesterolemia, unspecified: Secondary | ICD-10-CM | POA: Diagnosis not present

## 2021-07-01 DIAGNOSIS — I10 Essential (primary) hypertension: Secondary | ICD-10-CM | POA: Diagnosis not present

## 2021-07-01 DIAGNOSIS — E039 Hypothyroidism, unspecified: Secondary | ICD-10-CM | POA: Diagnosis not present

## 2021-07-07 DIAGNOSIS — R252 Cramp and spasm: Secondary | ICD-10-CM | POA: Diagnosis not present

## 2021-07-07 DIAGNOSIS — E042 Nontoxic multinodular goiter: Secondary | ICD-10-CM | POA: Diagnosis not present

## 2021-07-11 DIAGNOSIS — G4733 Obstructive sleep apnea (adult) (pediatric): Secondary | ICD-10-CM | POA: Diagnosis not present

## 2021-07-11 DIAGNOSIS — I10 Essential (primary) hypertension: Secondary | ICD-10-CM | POA: Diagnosis not present

## 2021-09-10 DIAGNOSIS — R300 Dysuria: Secondary | ICD-10-CM | POA: Diagnosis not present

## 2021-09-16 DIAGNOSIS — G4733 Obstructive sleep apnea (adult) (pediatric): Secondary | ICD-10-CM | POA: Diagnosis not present

## 2021-09-16 DIAGNOSIS — I10 Essential (primary) hypertension: Secondary | ICD-10-CM | POA: Diagnosis not present

## 2021-10-22 DIAGNOSIS — D2272 Melanocytic nevi of left lower limb, including hip: Secondary | ICD-10-CM | POA: Diagnosis not present

## 2021-10-22 DIAGNOSIS — D225 Melanocytic nevi of trunk: Secondary | ICD-10-CM | POA: Diagnosis not present

## 2021-10-22 DIAGNOSIS — L578 Other skin changes due to chronic exposure to nonionizing radiation: Secondary | ICD-10-CM | POA: Diagnosis not present

## 2021-10-22 DIAGNOSIS — L821 Other seborrheic keratosis: Secondary | ICD-10-CM | POA: Diagnosis not present

## 2021-10-22 DIAGNOSIS — L570 Actinic keratosis: Secondary | ICD-10-CM | POA: Diagnosis not present

## 2021-11-04 DIAGNOSIS — G4733 Obstructive sleep apnea (adult) (pediatric): Secondary | ICD-10-CM | POA: Diagnosis not present

## 2021-11-04 DIAGNOSIS — I10 Essential (primary) hypertension: Secondary | ICD-10-CM | POA: Diagnosis not present

## 2021-12-12 DIAGNOSIS — Z7189 Other specified counseling: Secondary | ICD-10-CM | POA: Diagnosis not present

## 2021-12-12 DIAGNOSIS — E039 Hypothyroidism, unspecified: Secondary | ICD-10-CM | POA: Diagnosis not present

## 2021-12-12 DIAGNOSIS — E559 Vitamin D deficiency, unspecified: Secondary | ICD-10-CM | POA: Diagnosis not present

## 2021-12-12 DIAGNOSIS — H35379 Puckering of macula, unspecified eye: Secondary | ICD-10-CM | POA: Diagnosis not present

## 2021-12-12 DIAGNOSIS — E7800 Pure hypercholesterolemia, unspecified: Secondary | ICD-10-CM | POA: Diagnosis not present

## 2021-12-12 DIAGNOSIS — Z0000 Encounter for general adult medical examination without abnormal findings: Secondary | ICD-10-CM | POA: Diagnosis not present

## 2021-12-12 DIAGNOSIS — I10 Essential (primary) hypertension: Secondary | ICD-10-CM | POA: Diagnosis not present

## 2022-02-02 DIAGNOSIS — I10 Essential (primary) hypertension: Secondary | ICD-10-CM | POA: Diagnosis not present

## 2022-02-02 DIAGNOSIS — G4733 Obstructive sleep apnea (adult) (pediatric): Secondary | ICD-10-CM | POA: Diagnosis not present

## 2022-03-20 DIAGNOSIS — G4733 Obstructive sleep apnea (adult) (pediatric): Secondary | ICD-10-CM | POA: Diagnosis not present

## 2022-03-20 DIAGNOSIS — I10 Essential (primary) hypertension: Secondary | ICD-10-CM | POA: Diagnosis not present

## 2022-04-24 DIAGNOSIS — Z23 Encounter for immunization: Secondary | ICD-10-CM | POA: Diagnosis not present

## 2022-05-21 DIAGNOSIS — G4733 Obstructive sleep apnea (adult) (pediatric): Secondary | ICD-10-CM | POA: Diagnosis not present

## 2022-05-21 DIAGNOSIS — I10 Essential (primary) hypertension: Secondary | ICD-10-CM | POA: Diagnosis not present

## 2022-06-15 DIAGNOSIS — I10 Essential (primary) hypertension: Secondary | ICD-10-CM | POA: Diagnosis not present

## 2022-06-15 DIAGNOSIS — Z23 Encounter for immunization: Secondary | ICD-10-CM | POA: Diagnosis not present

## 2022-06-15 DIAGNOSIS — G4733 Obstructive sleep apnea (adult) (pediatric): Secondary | ICD-10-CM | POA: Diagnosis not present

## 2022-06-15 DIAGNOSIS — E7800 Pure hypercholesterolemia, unspecified: Secondary | ICD-10-CM | POA: Diagnosis not present

## 2022-06-15 DIAGNOSIS — J302 Other seasonal allergic rhinitis: Secondary | ICD-10-CM | POA: Diagnosis not present

## 2022-06-15 DIAGNOSIS — E039 Hypothyroidism, unspecified: Secondary | ICD-10-CM | POA: Diagnosis not present

## 2022-07-10 DIAGNOSIS — Z1231 Encounter for screening mammogram for malignant neoplasm of breast: Secondary | ICD-10-CM | POA: Diagnosis not present

## 2022-09-11 DIAGNOSIS — I10 Essential (primary) hypertension: Secondary | ICD-10-CM | POA: Diagnosis not present

## 2022-09-11 DIAGNOSIS — G4733 Obstructive sleep apnea (adult) (pediatric): Secondary | ICD-10-CM | POA: Diagnosis not present

## 2022-10-27 DIAGNOSIS — L570 Actinic keratosis: Secondary | ICD-10-CM | POA: Diagnosis not present

## 2022-10-27 DIAGNOSIS — D2272 Melanocytic nevi of left lower limb, including hip: Secondary | ICD-10-CM | POA: Diagnosis not present

## 2022-10-27 DIAGNOSIS — D225 Melanocytic nevi of trunk: Secondary | ICD-10-CM | POA: Diagnosis not present

## 2022-10-27 DIAGNOSIS — L578 Other skin changes due to chronic exposure to nonionizing radiation: Secondary | ICD-10-CM | POA: Diagnosis not present

## 2022-10-27 DIAGNOSIS — L821 Other seborrheic keratosis: Secondary | ICD-10-CM | POA: Diagnosis not present

## 2022-10-30 DIAGNOSIS — L040 Acute lymphadenitis of face, head and neck: Secondary | ICD-10-CM | POA: Diagnosis not present

## 2022-10-30 NOTE — Progress Notes (Signed)
 Patient Name:  Jeanne Hill Date Of Birth:  1945/10/13 Medical Record Number:  [id number] Date:  10/30/2022 Time: 1:43 PM  CHIEF COMPLAINT   Earache (Swollen lymph node on right side x3-4 days )  ASSESSMENT AND PLAN   Assessment/Plan Jeanne Hill was seen today for earache. Diagnoses and all orders for this visit: 1. Acute cervical lymphadenitis (Primary) -     amoxicillin-pot clavulanate (AUGMENTIN) 875-125 mg per tablet; Take 1 tablet by mouth in the morning and 1 tablet before bedtime. Do all this for 7 days.  Dispense: 14 tablet; Refill: 0  MDM Number of Diagnoses or Management Options Diagnosis management comments: Patient has right anterior cervical adenitis without secondary surrounding infection.  Will initiate empiric antibiotic.  Discussed side effects of medication.  Strict precautions given for continued symptoms or worsening of symptoms and need for further evaluation with PCP.  Discussed possibility of needing imaging such as ultrasound and/or biopsy to exclude lymphoma if symptoms persist greater than 3-4 weeks.  Patient states understanding and agrees to treatment and plan.   HISTORY OF PRESENT ILLNESS   Subjective  Patient complaining of swollen lymph node to right side neck x1 week.  Concern for possible ear infection.  Denies fever, sore throat, cough, congestion cat scratch or bite, shortness of breath, abdominal pain, vomiting, diarrhea, rash.   History provided by:  Patient Earache  There is pain in the right ear. This is a new problem. The current episode started in the past 7 days. The problem occurs constantly. The problem has been unchanged. There has been no fever. The pain is mild. Associated symptoms include neck pain. Pertinent negatives include no abdominal pain, coughing, diarrhea, ear discharge, headaches, hearing loss, rash, rhinorrhea, sore throat or vomiting. She has tried nothing for the symptoms.     HISTORIES REVIEWED   The following sections of  the medical record have been reviewed, and updated as appropriate, during this encounter.  Allergies  Meds  Problems  Med Hx  Surg Hx  Fam Hx      REVIEW OF SYSTEMS   Review of Systems  Constitutional:  Negative for appetite change, chills and fever.  HENT:  Positive for ear pain. Negative for congestion, ear discharge, hearing loss, rhinorrhea, sore throat, trouble swallowing and voice change.   Eyes:  Negative for redness and visual disturbance.  Respiratory:  Negative for cough and shortness of breath.   Cardiovascular:  Negative for chest pain and palpitations.  Gastrointestinal:  Negative for abdominal pain, diarrhea, nausea and vomiting.  Genitourinary:  Negative for dysuria and frequency.  Musculoskeletal:  Positive for neck pain. Negative for arthralgias, back pain and myalgias.  Skin:  Negative for pallor, rash and wound.  Neurological:  Negative for dizziness and headaches.  Hematological:  Positive for adenopathy.   Objective Vitals:   10/30/22 1315  BP: 105/69  BP Location: Right arm  Patient Position: Sitting  Pulse: 67  Resp: 17  Temp: 36.2 C (97.2 F)  TempSrc: Skin  SpO2: 100%  Weight: 142 lb 12.8 oz (64.8 kg)  Height: 5' 4 (1.626 m)    PHYSICAL EXAMINATION   Physical Exam Vitals and nursing note reviewed.  Constitutional:      General: She is not in acute distress.    Appearance: She is well-developed.  HENT:     Head: Normocephalic and atraumatic.     Right Ear: Tympanic membrane and ear canal normal.     Left Ear: Tympanic membrane and ear canal normal.  Nose: Nose normal.     Mouth/Throat:     Mouth: Mucous membranes are moist.     Pharynx: Oropharynx is clear. Uvula midline. No oropharyngeal exudate, posterior oropharyngeal erythema or uvula swelling.     Tonsils: No tonsillar exudate or tonsillar abscesses. 0 on the right. 0 on the left.  Eyes:     General: No scleral icterus.    Conjunctiva/sclera: Conjunctivae normal.     Pupils:  Pupils are equal, round, and reactive to light.  Neck:     Trachea: Trachea and phonation normal.     Meningeal: Kernig's sign absent.  Cardiovascular:     Rate and Rhythm: Normal rate and regular rhythm.     Heart sounds: Normal heart sounds.  Pulmonary:     Effort: Pulmonary effort is normal. No respiratory distress.     Breath sounds: Normal breath sounds. No wheezing.  Abdominal:     Palpations: Abdomen is soft.     Tenderness: There is no abdominal tenderness.  Musculoskeletal:        General: No deformity.     Cervical back: Normal range of motion. No edema, erythema, rigidity, torticollis or tenderness. No pain with movement, spinous process tenderness or muscular tenderness. Normal range of motion.  Lymphadenopathy:     Cervical: Cervical adenopathy present.     Right cervical: Superficial cervical adenopathy present.  Skin:    General: Skin is warm and dry.  Neurological:     Mental Status: She is alert and oriented to person, place, and time.  Psychiatric:        Behavior: Behavior normal.     FOLLOW UP   Return if symptoms worsen or fail to improve.  Jeanne Hill FORBES [place], PA

## 2022-11-02 DIAGNOSIS — K122 Cellulitis and abscess of mouth: Secondary | ICD-10-CM | POA: Diagnosis not present

## 2022-11-02 DIAGNOSIS — K115 Sialolithiasis: Secondary | ICD-10-CM | POA: Diagnosis not present

## 2022-11-02 DIAGNOSIS — I10 Essential (primary) hypertension: Secondary | ICD-10-CM | POA: Diagnosis not present

## 2022-11-03 ENCOUNTER — Ambulatory Visit
Admission: RE | Admit: 2022-11-03 | Discharge: 2022-11-03 | Disposition: A | Discharge status: Home / Self Care | Payer: Medicare Other | Source: Ambulatory Visit | Attending: Internal Medicine | Admitting: Internal Medicine

## 2022-11-03 ENCOUNTER — Other Ambulatory Visit: Payer: Self-pay | Admitting: Internal Medicine

## 2022-11-03 ENCOUNTER — Other Ambulatory Visit: Payer: Self-pay

## 2022-11-03 ENCOUNTER — Encounter: Payer: Self-pay | Admitting: Internal Medicine

## 2022-11-03 DIAGNOSIS — K122 Cellulitis and abscess of mouth: Secondary | ICD-10-CM

## 2022-11-03 DIAGNOSIS — R220 Localized swelling, mass and lump, head: Secondary | ICD-10-CM | POA: Diagnosis not present

## 2022-11-03 DIAGNOSIS — I6529 Occlusion and stenosis of unspecified carotid artery: Secondary | ICD-10-CM | POA: Diagnosis not present

## 2022-11-03 DIAGNOSIS — E041 Nontoxic single thyroid nodule: Secondary | ICD-10-CM | POA: Diagnosis not present

## 2022-11-09 DIAGNOSIS — E041 Nontoxic single thyroid nodule: Secondary | ICD-10-CM | POA: Diagnosis not present

## 2022-12-07 ENCOUNTER — Other Ambulatory Visit: Payer: Self-pay | Admitting: Endocrinology

## 2022-12-07 DIAGNOSIS — I10 Essential (primary) hypertension: Secondary | ICD-10-CM | POA: Diagnosis not present

## 2022-12-07 DIAGNOSIS — E039 Hypothyroidism, unspecified: Secondary | ICD-10-CM | POA: Diagnosis not present

## 2022-12-07 DIAGNOSIS — E041 Nontoxic single thyroid nodule: Secondary | ICD-10-CM | POA: Diagnosis not present

## 2022-12-22 DIAGNOSIS — G4733 Obstructive sleep apnea (adult) (pediatric): Secondary | ICD-10-CM | POA: Diagnosis not present

## 2022-12-22 DIAGNOSIS — E559 Vitamin D deficiency, unspecified: Secondary | ICD-10-CM | POA: Diagnosis not present

## 2022-12-22 DIAGNOSIS — E041 Nontoxic single thyroid nodule: Secondary | ICD-10-CM | POA: Diagnosis not present

## 2022-12-22 DIAGNOSIS — I10 Essential (primary) hypertension: Secondary | ICD-10-CM | POA: Diagnosis not present

## 2022-12-22 DIAGNOSIS — E039 Hypothyroidism, unspecified: Secondary | ICD-10-CM | POA: Diagnosis not present

## 2022-12-22 DIAGNOSIS — H35379 Puckering of macula, unspecified eye: Secondary | ICD-10-CM | POA: Diagnosis not present

## 2022-12-22 DIAGNOSIS — E7800 Pure hypercholesterolemia, unspecified: Secondary | ICD-10-CM | POA: Diagnosis not present

## 2022-12-22 DIAGNOSIS — Z0000 Encounter for general adult medical examination without abnormal findings: Secondary | ICD-10-CM | POA: Diagnosis not present

## 2022-12-22 DIAGNOSIS — Z803 Family history of malignant neoplasm of breast: Secondary | ICD-10-CM | POA: Diagnosis not present

## 2022-12-23 ENCOUNTER — Other Ambulatory Visit: Payer: Self-pay | Admitting: Internal Medicine

## 2022-12-23 DIAGNOSIS — E2839 Other primary ovarian failure: Secondary | ICD-10-CM

## 2023-01-06 ENCOUNTER — Ambulatory Visit
Admission: RE | Admit: 2023-01-06 | Discharge: 2023-01-06 | Disposition: A | Discharge status: Home / Self Care | Payer: Medicare Other | Source: Ambulatory Visit | Attending: Endocrinology | Admitting: Endocrinology

## 2023-01-06 DIAGNOSIS — E042 Nontoxic multinodular goiter: Secondary | ICD-10-CM | POA: Diagnosis not present

## 2023-01-06 DIAGNOSIS — E041 Nontoxic single thyroid nodule: Secondary | ICD-10-CM

## 2023-01-11 ENCOUNTER — Other Ambulatory Visit: Payer: Self-pay | Admitting: Endocrinology

## 2023-01-11 DIAGNOSIS — E041 Nontoxic single thyroid nodule: Secondary | ICD-10-CM

## 2023-01-21 DIAGNOSIS — H35371 Puckering of macula, right eye: Secondary | ICD-10-CM | POA: Diagnosis not present

## 2023-01-23 DIAGNOSIS — I10 Essential (primary) hypertension: Secondary | ICD-10-CM | POA: Diagnosis not present

## 2023-01-23 DIAGNOSIS — G4733 Obstructive sleep apnea (adult) (pediatric): Secondary | ICD-10-CM | POA: Diagnosis not present

## 2023-02-19 ENCOUNTER — Ambulatory Visit
Admission: RE | Admit: 2023-02-19 | Discharge: 2023-02-19 | Disposition: A | Discharge status: Home / Self Care | Payer: Medicare Other | Source: Ambulatory Visit | Attending: Endocrinology | Admitting: Endocrinology

## 2023-02-19 ENCOUNTER — Other Ambulatory Visit (HOSPITAL_COMMUNITY)
Admission: RE | Admit: 2023-02-19 | Discharge: 2023-02-19 | Disposition: A | Discharge status: Home / Self Care | Payer: Medicare Other | Source: Ambulatory Visit | Attending: Interventional Radiology | Admitting: Interventional Radiology

## 2023-02-19 DIAGNOSIS — E041 Nontoxic single thyroid nodule: Secondary | ICD-10-CM | POA: Diagnosis not present

## 2023-02-22 ENCOUNTER — Other Ambulatory Visit: Payer: Medicare Other

## 2023-02-22 DIAGNOSIS — E559 Vitamin D deficiency, unspecified: Secondary | ICD-10-CM | POA: Diagnosis not present

## 2023-02-22 LAB — CYTOLOGY - NON PAP

## 2023-06-28 DIAGNOSIS — E039 Hypothyroidism, unspecified: Secondary | ICD-10-CM | POA: Diagnosis not present

## 2023-06-28 DIAGNOSIS — G4733 Obstructive sleep apnea (adult) (pediatric): Secondary | ICD-10-CM | POA: Diagnosis not present

## 2023-06-28 DIAGNOSIS — I10 Essential (primary) hypertension: Secondary | ICD-10-CM | POA: Diagnosis not present

## 2023-06-28 DIAGNOSIS — E042 Nontoxic multinodular goiter: Secondary | ICD-10-CM | POA: Diagnosis not present

## 2023-07-14 DIAGNOSIS — G4733 Obstructive sleep apnea (adult) (pediatric): Secondary | ICD-10-CM | POA: Diagnosis not present

## 2023-07-20 ENCOUNTER — Ambulatory Visit
Admission: RE | Admit: 2023-07-20 | Discharge: 2023-07-20 | Disposition: A | Discharge status: Home / Self Care | Payer: Medicare Other | Source: Ambulatory Visit | Attending: Internal Medicine | Admitting: Internal Medicine

## 2023-07-20 DIAGNOSIS — E2839 Other primary ovarian failure: Secondary | ICD-10-CM

## 2023-07-20 DIAGNOSIS — M8588 Other specified disorders of bone density and structure, other site: Secondary | ICD-10-CM | POA: Diagnosis not present

## 2023-10-12 DIAGNOSIS — L578 Other skin changes due to chronic exposure to nonionizing radiation: Secondary | ICD-10-CM | POA: Diagnosis not present

## 2023-10-12 DIAGNOSIS — L570 Actinic keratosis: Secondary | ICD-10-CM | POA: Diagnosis not present

## 2023-10-12 DIAGNOSIS — L821 Other seborrheic keratosis: Secondary | ICD-10-CM | POA: Diagnosis not present

## 2023-10-12 DIAGNOSIS — D225 Melanocytic nevi of trunk: Secondary | ICD-10-CM | POA: Diagnosis not present

## 2023-10-12 DIAGNOSIS — D239 Other benign neoplasm of skin, unspecified: Secondary | ICD-10-CM | POA: Diagnosis not present

## 2023-10-12 DIAGNOSIS — D2271 Melanocytic nevi of right lower limb, including hip: Secondary | ICD-10-CM | POA: Diagnosis not present

## 2023-10-12 DIAGNOSIS — D485 Neoplasm of uncertain behavior of skin: Secondary | ICD-10-CM | POA: Diagnosis not present

## 2023-10-12 DIAGNOSIS — D2272 Melanocytic nevi of left lower limb, including hip: Secondary | ICD-10-CM | POA: Diagnosis not present

## 2023-12-07 DIAGNOSIS — E041 Nontoxic single thyroid nodule: Secondary | ICD-10-CM | POA: Diagnosis not present

## 2023-12-07 DIAGNOSIS — E039 Hypothyroidism, unspecified: Secondary | ICD-10-CM | POA: Diagnosis not present

## 2023-12-07 DIAGNOSIS — I10 Essential (primary) hypertension: Secondary | ICD-10-CM | POA: Diagnosis not present

## 2024-01-10 DIAGNOSIS — G4733 Obstructive sleep apnea (adult) (pediatric): Secondary | ICD-10-CM | POA: Diagnosis not present

## 2024-01-10 DIAGNOSIS — M85859 Other specified disorders of bone density and structure, unspecified thigh: Secondary | ICD-10-CM | POA: Diagnosis not present

## 2024-01-10 DIAGNOSIS — E042 Nontoxic multinodular goiter: Secondary | ICD-10-CM | POA: Diagnosis not present

## 2024-01-10 DIAGNOSIS — E7800 Pure hypercholesterolemia, unspecified: Secondary | ICD-10-CM | POA: Diagnosis not present

## 2024-01-10 DIAGNOSIS — E039 Hypothyroidism, unspecified: Secondary | ICD-10-CM | POA: Diagnosis not present

## 2024-01-10 DIAGNOSIS — E559 Vitamin D deficiency, unspecified: Secondary | ICD-10-CM | POA: Diagnosis not present

## 2024-01-10 DIAGNOSIS — Z0000 Encounter for general adult medical examination without abnormal findings: Secondary | ICD-10-CM | POA: Diagnosis not present

## 2024-01-10 DIAGNOSIS — Z23 Encounter for immunization: Secondary | ICD-10-CM | POA: Diagnosis not present

## 2024-01-10 DIAGNOSIS — I10 Essential (primary) hypertension: Secondary | ICD-10-CM | POA: Diagnosis not present

## 2024-01-10 DIAGNOSIS — J301 Allergic rhinitis due to pollen: Secondary | ICD-10-CM | POA: Diagnosis not present

## 2024-01-22 DIAGNOSIS — J069 Acute upper respiratory infection, unspecified: Secondary | ICD-10-CM | POA: Diagnosis not present

## 2024-03-30 ENCOUNTER — Other Ambulatory Visit: Payer: Self-pay

## 2024-03-30 ENCOUNTER — Emergency Department (HOSPITAL_COMMUNITY)

## 2024-03-30 ENCOUNTER — Observation Stay (HOSPITAL_COMMUNITY)
Admission: EM | Admit: 2024-03-30 | Discharge: 2024-03-31 | Disposition: A | Discharge status: Home / Self Care | Attending: Internal Medicine | Admitting: Internal Medicine

## 2024-03-30 ENCOUNTER — Observation Stay (HOSPITAL_COMMUNITY)

## 2024-03-30 ENCOUNTER — Encounter (HOSPITAL_COMMUNITY): Payer: Self-pay | Admitting: Emergency Medicine

## 2024-03-30 DIAGNOSIS — R918 Other nonspecific abnormal finding of lung field: Secondary | ICD-10-CM | POA: Diagnosis not present

## 2024-03-30 DIAGNOSIS — N179 Acute kidney failure, unspecified: Secondary | ICD-10-CM | POA: Diagnosis not present

## 2024-03-30 DIAGNOSIS — E041 Nontoxic single thyroid nodule: Secondary | ICD-10-CM | POA: Diagnosis not present

## 2024-03-30 DIAGNOSIS — F1090 Alcohol use, unspecified, uncomplicated: Secondary | ICD-10-CM | POA: Insufficient documentation

## 2024-03-30 DIAGNOSIS — R55 Syncope and collapse: Secondary | ICD-10-CM

## 2024-03-30 DIAGNOSIS — R197 Diarrhea, unspecified: Secondary | ICD-10-CM | POA: Insufficient documentation

## 2024-03-30 DIAGNOSIS — G4733 Obstructive sleep apnea (adult) (pediatric): Secondary | ICD-10-CM | POA: Insufficient documentation

## 2024-03-30 DIAGNOSIS — I10 Essential (primary) hypertension: Secondary | ICD-10-CM | POA: Diagnosis not present

## 2024-03-30 DIAGNOSIS — I351 Nonrheumatic aortic (valve) insufficiency: Secondary | ICD-10-CM | POA: Insufficient documentation

## 2024-03-30 DIAGNOSIS — E039 Hypothyroidism, unspecified: Secondary | ICD-10-CM | POA: Insufficient documentation

## 2024-03-30 DIAGNOSIS — I719 Aortic aneurysm of unspecified site, without rupture: Secondary | ICD-10-CM | POA: Diagnosis not present

## 2024-03-30 DIAGNOSIS — Z79890 Hormone replacement therapy (postmenopausal): Secondary | ICD-10-CM | POA: Diagnosis not present

## 2024-03-30 DIAGNOSIS — W19XXXA Unspecified fall, initial encounter: Secondary | ICD-10-CM | POA: Diagnosis not present

## 2024-03-30 DIAGNOSIS — R110 Nausea: Secondary | ICD-10-CM | POA: Diagnosis not present

## 2024-03-30 DIAGNOSIS — R0989 Other specified symptoms and signs involving the circulatory and respiratory systems: Secondary | ICD-10-CM | POA: Diagnosis not present

## 2024-03-30 DIAGNOSIS — Z79899 Other long term (current) drug therapy: Secondary | ICD-10-CM | POA: Diagnosis not present

## 2024-03-30 DIAGNOSIS — R1084 Generalized abdominal pain: Secondary | ICD-10-CM | POA: Diagnosis not present

## 2024-03-30 DIAGNOSIS — J984 Other disorders of lung: Secondary | ICD-10-CM | POA: Diagnosis not present

## 2024-03-30 DIAGNOSIS — S0990XA Unspecified injury of head, initial encounter: Secondary | ICD-10-CM | POA: Diagnosis not present

## 2024-03-30 LAB — CBC WITH DIFFERENTIAL/PLATELET
Abs Immature Granulocytes: 0.06 K/uL (ref 0.00–0.07)
Basophils Absolute: 0 K/uL (ref 0.0–0.1)
Basophils Relative: 0 %
Eosinophils Absolute: 0.2 K/uL (ref 0.0–0.5)
Eosinophils Relative: 2 %
HCT: 40.9 % (ref 36.0–46.0)
Hemoglobin: 13.7 g/dL (ref 12.0–15.0)
Immature Granulocytes: 1 %
Lymphocytes Relative: 20 %
Lymphs Abs: 1.7 K/uL (ref 0.7–4.0)
MCH: 30 pg (ref 26.0–34.0)
MCHC: 33.5 g/dL (ref 30.0–36.0)
MCV: 89.7 fL (ref 80.0–100.0)
Monocytes Absolute: 0.7 K/uL (ref 0.1–1.0)
Monocytes Relative: 8 %
Neutro Abs: 5.9 K/uL (ref 1.7–7.7)
Neutrophils Relative %: 69 %
Platelets: 209 K/uL (ref 150–400)
RBC: 4.56 MIL/uL (ref 3.87–5.11)
RDW: 13.2 % (ref 11.5–15.5)
WBC: 8.5 K/uL (ref 4.0–10.5)
nRBC: 0 % (ref 0.0–0.2)

## 2024-03-30 LAB — COMPREHENSIVE METABOLIC PANEL WITH GFR
ALT: 22 U/L (ref 0–44)
AST: 30 U/L (ref 15–41)
Albumin: 3.4 g/dL — ABNORMAL LOW (ref 3.5–5.0)
Alkaline Phosphatase: 59 U/L (ref 38–126)
Anion gap: 11 (ref 5–15)
BUN: 25 mg/dL — ABNORMAL HIGH (ref 8–23)
CO2: 22 mmol/L (ref 22–32)
Calcium: 10.2 mg/dL (ref 8.9–10.3)
Chloride: 104 mmol/L (ref 98–111)
Creatinine, Ser: 1.41 mg/dL — ABNORMAL HIGH (ref 0.44–1.00)
GFR, Estimated: 38 mL/min — ABNORMAL LOW (ref >60)
Glucose, Bld: 107 mg/dL — ABNORMAL HIGH (ref 70–99)
Potassium: 4 mmol/L (ref 3.5–5.1)
Sodium: 137 mmol/L (ref 135–145)
Total Bilirubin: 0.6 mg/dL (ref 0.0–1.2)
Total Protein: 6.1 g/dL — ABNORMAL LOW (ref 6.5–8.1)

## 2024-03-30 LAB — ECHOCARDIOGRAM COMPLETE
AR max vel: 2.21 cm2
AV Area VTI: 2.37 cm2
AV Area mean vel: 2.21 cm2
AV Mean grad: 6 mmHg
AV Peak grad: 11.3 mmHg
Ao pk vel: 1.68 m/s
Area-P 1/2: 5.27 cm2
Calc EF: 71 %
Height: 64 in
S' Lateral: 3.59 cm
Single Plane A2C EF: 68.6 %
Single Plane A4C EF: 72.5 %
Weight: 2336 [oz_av]

## 2024-03-30 LAB — BRAIN NATRIURETIC PEPTIDE: B Natriuretic Peptide: 37.1 pg/mL (ref 0.0–100.0)

## 2024-03-30 LAB — URINALYSIS, ROUTINE W REFLEX MICROSCOPIC
Bilirubin Urine: NEGATIVE
Glucose, UA: NEGATIVE mg/dL
Hgb urine dipstick: NEGATIVE
Ketones, ur: NEGATIVE mg/dL
Leukocytes,Ua: NEGATIVE
Nitrite: NEGATIVE
Protein, ur: NEGATIVE mg/dL
Specific Gravity, Urine: 1.011 (ref 1.005–1.030)
pH: 5 (ref 5.0–8.0)

## 2024-03-30 LAB — RESP PANEL BY RT-PCR (RSV, FLU A&B, COVID)  RVPGX2
Influenza A by PCR: NEGATIVE
Influenza B by PCR: NEGATIVE
Resp Syncytial Virus by PCR: NEGATIVE
SARS Coronavirus 2 by RT PCR: NEGATIVE

## 2024-03-30 LAB — TROPONIN I (HIGH SENSITIVITY)
Troponin I (High Sensitivity): 4 ng/L (ref <18)
Troponin I (High Sensitivity): 6 ng/L (ref <18)

## 2024-03-30 LAB — TSH: TSH: 1.166 u[IU]/mL (ref 0.350–4.500)

## 2024-03-30 MED ORDER — ACETAMINOPHEN 650 MG RE SUPP
650.0000 mg | Freq: Four times a day (QID) | RECTAL | Status: DC | PRN
Start: 2024-03-30 — End: 2024-03-31

## 2024-03-30 MED ORDER — ACETAMINOPHEN 325 MG PO TABS: 650.0000 mg | ORAL_TABLET | Freq: Four times a day (QID) | ORAL | Status: DC | PRN

## 2024-03-30 MED ORDER — ENOXAPARIN SODIUM 40 MG/0.4ML IJ SOSY
40.0000 mg | PREFILLED_SYRINGE | INTRAMUSCULAR | Status: DC
  Administered 2024-03-30 – 2024-03-31 (×2): 40 mg via SUBCUTANEOUS
  Filled 2024-03-30 (×2): qty 0.4

## 2024-03-30 MED ORDER — IOHEXOL 350 MG/ML SOLN
75.0000 mL | Freq: Once | INTRAVENOUS | Status: AC | PRN
  Administered 2024-03-30: 75 mL via INTRAVENOUS

## 2024-03-30 MED ORDER — SODIUM CHLORIDE 0.9% FLUSH
3.0000 mL | Freq: Two times a day (BID) | INTRAVENOUS | Status: DC
  Administered 2024-03-30 (×2): 3 mL via INTRAVENOUS

## 2024-03-30 MED ORDER — LEVOTHYROXINE SODIUM 50 MCG PO TABS: 50.0000 ug | ORAL_TABLET | ORAL | Status: DC

## 2024-03-30 MED ORDER — LEVOTHYROXINE SODIUM 75 MCG PO TABS
75.0000 ug | ORAL_TABLET | ORAL | Status: DC
  Administered 2024-03-31: 75 ug via ORAL
  Filled 2024-03-30: qty 1

## 2024-03-30 MED ORDER — SODIUM CHLORIDE 0.9 % IV SOLN: INTRAVENOUS | Status: DC

## 2024-03-30 MED ORDER — SODIUM CHLORIDE 0.9 % IV BOLUS
500.0000 mL | Freq: Once | INTRAVENOUS | Status: AC
  Administered 2024-03-30: 500 mL via INTRAVENOUS

## 2024-03-30 MED ORDER — LORATADINE 10 MG PO TABS
10.0000 mg | ORAL_TABLET | Freq: Every day | ORAL | Status: DC
  Administered 2024-03-30 – 2024-03-31 (×2): 10 mg via ORAL
  Filled 2024-03-30 (×2): qty 1

## 2024-03-30 MED ORDER — ALBUTEROL SULFATE (2.5 MG/3ML) 0.083% IN NEBU: 2.5000 mg | INHALATION_SOLUTION | Freq: Four times a day (QID) | RESPIRATORY_TRACT | Status: DC | PRN

## 2024-03-30 NOTE — Care Management Obs Status (Signed)
 MEDICARE OBSERVATION STATUS NOTIFICATION   Patient Details  Name: Jeanne Hill MRN: [id number] Date of Birth: 1946-04-27   Medicare Observation Status Notification Given:  Yes  Verbally reviewed observation notice with Rojelio Harrison telephonically at [phone].    Chrishaun Sasso 03/30/2024, 4:04 PM

## 2024-03-30 NOTE — ED Triage Notes (Signed)
 Pt bib [facility] from home. Pt got up and went to bathroom and husband found her passed out. By time ems arrived patient was fully conscious. Pt has had this happened before  Pt reports nausea,diarrhea and abdominal cramps intermittently. No reports of hitting head.   4mg  zofran  given by ems.   EMS VS  113/42 61 hr  100% RA  132 CBG   18G RAC   HX: htn , thyroid 

## 2024-03-30 NOTE — H&P (Addendum)
 History and Physical    Patient: Jeanne Hill FMW:[id number] DOB: 1945/09/30 DOA: 03/30/2024 DOS: the patient was seen and examined on 03/30/2024 PCP: Shamleffer, Donell Cardinal, MD  Patient coming from: Home via EMS  Chief Complaint:  Chief Complaint  Patient presents with   Loss of Consciousness   HPI: Jeanne Hill is a 78 y.o. female with medical history significant of hypertension, hypothyroidism, and sleep apnea presents with episodes of diarrhea and syncope.  She has experienced episodes of diarrhea for the past fifty years, characterized by an urgent need to reach the bathroom to avoid accidents. These episodes are accompanied by severe stomach cramping and increased saliva production, which she swallows to prevent vomiting. During these episodes, she feels like she might pass out, which has happened multiple times in the past.  The most recent episode occurred last night when she passed out while on the toilet. Her husband found her after she did not return to bed in a timely manner and had difficulty waking her. She did not fall or hit her head during this episode, due to the corner where the toilet seat is. She was unsure how long she was unconscious. She has not experienced an episode for several years prior to this incident and is unsure what triggered it. She notes that she usually recovers quickly from these episodes, but this time she felt 'foggy' and had difficulty thinking clearly when her husband asked her questions.  Her medical history includes hypothyroidism and hypertension, for which she takes lisinopril  and hydrochlorothiazide. She has not discussed these episodes with her primary care provider recently as she has not occurred for some time. No chest pain or discomfort during these episodes and no history of seizures. She used to use a CPAP machine but stopped as it did not improve her sleep. She does not smoke and drinks alcohol only occasionally.  In the ED  patient was noted to be afebrile with fairly stable vital signs.  Labs significant for BUN 25, creatinine 1.41, and high-sensitivity troponin negative x 2.  CT angiogram of the chest noted no evidence of a pulmonary embolism and was a groundglass attenuation in both lungs thought to be nonspecific.Patient was given a 500 mL IV fluid bolus.    Review of Systems: As mentioned in the history of present illness. All other systems reviewed and are negative. Past Medical History:  Diagnosis Date   Cancer (HCC)    nose- skin   Headache    Ocular migraines   Hypertension    Hypothyroidism    Sleep apnea    Past Surgical History:  Procedure Laterality Date   APPENDECTOMY     DILATATION & CURETTAGE/HYSTEROSCOPY WITH MYOSURE N/A 08/15/2015   Procedure: DILATATION & CURETTAGE/HYSTEROSCOPY WITH MYOSURE;  Surgeon: Hargis Paradise, MD;  Location: [facility];  Service: Gynecology;  Laterality: N/A;   MOUTH SURGERY     SKIN CANCER EXCISION     nose   TEAR DUCT PROBING     Social History:  reports that she has never smoked. She has never used smokeless tobacco. She reports current alcohol use. She reports that she does not use drugs.  No Known Allergies  Family History  Problem Relation Age of Onset   Diabetes Sister     Prior to Admission medications   Medication Sig Start Date End Date Taking? Authorizing Provider  Calcium Carbonate-Vitamin D (CALCIUM + D PO) Take 1 tablet by mouth daily.     [provider]  cetirizine (  ZYRTEC) 10 MG tablet Take 10 mg by mouth daily as needed for allergies.     [provider]  hydrochlorothiazide 25 MG tablet Take 25 mg by mouth daily.      [provider]  ibuprofen  (ADVIL ,MOTRIN ) 600 MG tablet Take 1 tablet (600 mg total) by mouth every 6 (six) hours as needed. 08/15/15   Timmie Norris, MD  levothyroxine  (SYNTHROID , LEVOTHROID) 88 MCG tablet Take 88 mcg by mouth daily before breakfast.    [provider]  lisinopril   (PRINIVIL ,ZESTRIL ) 5 MG tablet Take 5 mg by mouth daily.    [provider]  PARoxetine Mesylate (BRISDELLE) 7.5 MG CAPS Take 1 capsule by mouth at bedtime.    [provider]  triamcinolone  (NASACORT ALLERGY 24HR) 55 MCG/ACT AERO nasal inhaler Place 2 sprays into the nose daily.    [provider]    Physical Exam: Vitals:   03/30/24 0500 03/30/24 0715 03/30/24 0745 03/30/24 0815  BP: 111/87 119/67 111/66 (!) 132/91  Pulse: 66 74 93 89  Resp: (!) 21 17 18 19   Temp:      TempSrc:      SpO2: 100% 97% 98% 100%  Weight:      Height:       Constitutional: Elderly female who appears to be in no acute distress Eyes: PERRL, lids and conjunctivae normal ENMT: Mucous membranes are moist. Posterior pharynx clear of any exudate or lesions.Normal dentition.  Neck: normal, supple  Respiratory: clear to auscultation bilaterally, no wheezing, no crackles. Normal respiratory effort. No accessory muscle use.  Cardiovascular: Regular rate and rhythm, no murmurs / rubs / gallops. No extremity edema.  Abdomen: no tenderness, no masses palpated. Bowel sounds positive.  Musculoskeletal: no clubbing / cyanosis. No joint deformity upper and lower extremities. Good ROM, no contractures. Normal muscle tone.  Skin: no rashes, lesions, ulcers. No induration Neurologic: CN 2-12 grossly intact. Strength 5/5 in all 4.  Psychiatric: Normal judgment and insight. Alert and oriented x 3. Normal mood.   Data Reviewed:  EKG reveals sinus rhythm at 62 bpm.  Reviewed labs, imaging, and pertinent records as documented.  Assessment and Plan:  Syncope Patient presents after witnessed syncopal episode at home while trying to go use the restroom last night with reports of abdominal cramps.  She is unsure of how long she was out for and it reportedly took her some time before coming back to which her husband called EMS.  High-sensitivity troponins negative x 2.  EKG without significant signs for  ischemia.  Suspect the episodes of diarrhea with diuretic use as likely cause for syncopal episode. Question possibility of vasovagal syncope.  - Admit to medical telemetry bed - Check orthostatic vital signs - Check respiratory virus panel - Check TSH - Check echocardiogram - Follow-up telemetry overnight - PT to evaluate - May benefit from referral for wearing Holter monitor in the outpatient setting  Diarrhea Chronic. Patient reports having recent bouts of diarrhea, but reports chronically having these episodes for several years.  On physical exam no significant abdominal pain appreciated at this time. - Recommend outpatient follow-up with gastroenterology   Essential hypertension Blood pressures noted to be as low as 100/56 - 132/91. - Held lisinopril  and hydrochlorothiazide(Discussed possible need of stopping hydrochlorothiazide)  Suspected  acute kidney injury Patient presents with creatinine elevated up to 1.41 with BUN 25.  Baseline previously noted to be around 1, but from back in 2016. - Check urinalysis - Continue IV fluids - Recheck  kidney function in a.m.  Hypothyroidism - Check TSH - Continue levothyroxine   OSA Patient has history of sleep apnea but does not currently use the CPAP machine   DVT prophylaxis: Lovenox  Advance Care Planning:   Code Status: Full Code   Consults: None  Family Communication:  Husband updated over the phone  Severity of Illness: The appropriate patient status for this patient is OBSERVATION. Observation status is judged to be reasonable and necessary in order to provide the required intensity of service to ensure the patient's safety. The patient's presenting symptoms, physical exam findings, and initial radiographic and laboratory data in the context of their medical condition is felt to place them at decreased risk for further clinical deterioration. Furthermore, it is anticipated that the patient will be medically stable for discharge  from the hospital within 2 midnights of admission.   Author: Maximino DELENA Sharps, MD 03/30/2024 8:58 AM  For on call review [email or web address].

## 2024-03-30 NOTE — Plan of Care (Signed)

## 2024-03-30 NOTE — ED Provider Notes (Signed)
 Care assumed from PA Olam Slocumb at shift change, please see her note for full details, but in brief Jeanne Hill is a 78 y.o. female who presents after a syncopal episode.  Patient reports history of multiple prior syncopal episodes but has never sought evaluation previously.  History of hypertension and hypothyroidism.  Went to the bathroom and husband found her unresponsive.  Previously with syncope she has returned to baseline within a few seconds but it seems to take longer for him to wake her today and so he called EMS.  She reports that sometimes the syncopal episodes occur when using the bathroom but not always she also reports feeling some cramping in her legs at times.  No current chest pain, shortness of breath or palpitations.  Signed out pending CTA, BNP and delta troponin with plans for admission.   Physical Exam  BP 119/67   Pulse 74   Temp 97.8 F (36.6 C) (Axillary)   Resp 17   Ht 5' 4 (1.626 m)   Wt 66.2 kg   SpO2 97%   BMI 25.06 kg/m   Physical Exam Vitals and nursing note reviewed.  Constitutional:      General: She is not in acute distress.    Appearance: Normal appearance. She is well-developed. She is not ill-appearing or diaphoretic.  HENT:     Head: Normocephalic and atraumatic.  Eyes:     General:        Right eye: No discharge.        Left eye: No discharge.  Pulmonary:     Effort: Pulmonary effort is normal. No respiratory distress.  Neurological:     Mental Status: She is alert and oriented to person, place, and time.     Coordination: Coordination normal.  Psychiatric:        Mood and Affect: Mood normal.        Behavior: Behavior normal.     Procedures  Procedures  ED Course / MDM   Labs Reviewed  COMPREHENSIVE METABOLIC PANEL WITH GFR - Abnormal; Notable for the following components:      Result Value   Glucose, Bld 107 (*)    BUN 25 (*)    Creatinine, Ser 1.41 (*)    Total Protein 6.1 (*)    Albumin 3.4 (*)    GFR, Estimated  38 (*)    All other components within normal limits  RESP PANEL BY RT-PCR (RSV, FLU A&B, COVID)  RVPGX2  CBC WITH DIFFERENTIAL/PLATELET  BRAIN NATRIURETIC PEPTIDE  TROPONIN I (HIGH SENSITIVITY)  TROPONIN I (HIGH SENSITIVITY)   EKG Interpretation Date/Time:  Thursday March 30 2024 04:15:13 EDT Ventricular Rate:  62 PR Interval:  205 QRS Duration:  95 QT Interval:  390 QTC Calculation: 396 R Axis:   56  Text Interpretation: Sinus rhythm No significant change since last tracing Confirmed by Lorette Mayo [phone]) on 03/30/2024 6:08:41 AM  CT Angio Chest PE W and/or Wo Contrast Addendum Date: 03/30/2024 ADDENDUM REPORT: 03/30/2024 07:02 ADDENDUM: As noted in the body of the report, the ascending thoracic aorta measures 4.0 cm diameter. Recommend annual imaging followup by CTA or MRA. This recommendation follows 2010 ACCF/AHA/AATS/ACR/ASA/SCA/SCAI/SIR/STS/SVM Guidelines for the Diagnosis and Management of Patients with Thoracic Aortic Disease. Circulation. 2010; 121: Z733-z630. Aortic aneurysm NOS (ICD10-I71.9) Electronically Signed   By: Camellia Candle M.D.   On: 03/30/2024 07:02   Result Date: 03/30/2024 CLINICAL DATA:  Patient was found down. EXAM: CT ANGIOGRAPHY CHEST WITH CONTRAST TECHNIQUE: Multidetector CT imaging  of the chest was performed using the standard protocol during bolus administration of intravenous contrast. Multiplanar CT image reconstructions and MIPs were obtained to evaluate the vascular anatomy. RADIATION DOSE REDUCTION: This exam was performed according to the departmental dose-optimization program which includes automated exposure control, adjustment of the mA and/or kV according to patient size and/or use of iterative reconstruction technique. CONTRAST:  75mL OMNIPAQUE  IOHEXOL  350 MG/ML SOLN COMPARISON:  None Available. FINDINGS: Cardiovascular: The heart size is normal. No substantial pericardial effusion. Mild atherosclerotic calcification is noted in the wall of the  thoracic aorta. Ascending thoracic aorta measures 4 cm diameter. There is no filling defect within the opacified pulmonary arteries to suggest the presence of an acute pulmonary embolus. Mediastinum/Nodes: No mediastinal lymphadenopathy. Large left thyroid  nodule evident. This has been evaluated on previous imaging. (ref: J Am Coll Radiol. 2015 Feb;12(2): 143-50).There is no hilar lymphadenopathy. Calcified nodal tissue in the right hilum suggest remote granulomatous disease. The esophagus has normal imaging features. There is no axillary lymphadenopathy. Lungs/Pleura: No suspicious pulmonary nodule or mass. Mosaic ground-glass attenuation in both lungs is nonspecific but may reflect underlying air trapping from small airways disease. No dense focal consolidative pneumonia. No pleural effusion. Upper Abdomen: Visualized portion of the upper abdomen shows no acute findings. Musculoskeletal: No worrisome lytic or sclerotic osseous abnormality. Review of the MIP images confirms the above findings. IMPRESSION: 1. No CT evidence for acute pulmonary embolus. 2. Mosaic ground-glass attenuation in both lungs is nonspecific but may reflect underlying air trapping from small airways disease. 3.  Aortic Atherosclerosis (ICD10-I70.0). Electronically Signed: By: Camellia Candle M.D. On: 03/30/2024 06:57   DG Chest Port 1 View Result Date: 03/30/2024 EXAM: 1 VIEW XRAY OF THE CHEST 03/30/2024 05:09:50 AM COMPARISON: None available. CLINICAL HISTORY: Syncope. Bib [facility] from home. Pt got up and went to bathroom and husband found her passed out. FINDINGS: LUNGS AND PLEURA: There are mildly prominent interstitial opacities within the mid and lower lung zones bilaterally, of uncertain chronicity. No focal pulmonary opacity. No pulmonary edema. No pleural effusion. No pneumothorax. HEART AND MEDIASTINUM: No acute abnormality of the cardiac and mediastinal silhouettes. BONES AND SOFT TISSUES: No acute osseous abnormality. IMPRESSION: 1.  Mildly prominent interstitial opacities within the mid and lower lung zones bilaterally, of uncertain chronicity. Electronically signed by: Evalene Coho MD 03/30/2024 05:48 AM EDT RP Workstation: [id number]    Medical Decision Making Amount and/or Complexity of Data Reviewed Labs: ordered. Radiology: ordered.  Risk Prescription drug management.   CTA without evidence of PE.  Does show some mosaic groundglass opacities in both lungs suggestive of small airway disease, BNP is not elevated and delta troponin is normal.  Patient has remained on cardiac monitoring during ED visit without noted arrhythmia.  Patient did have mild elevation creatinine at 1.41 and BUN at 25, last labs available to compare to from 8 years ago, given small fluid bolus.  Given history of multiple syncopal episodes without prior evaluation feel patient would benefit from observation on telemetry and potential echocardiogram as well as potential medication adjustment, patient currently takes lisinopril  and HCTZ for hypertension.  Case discussed with Dr. Claudene with [facility] hospitalist who will see and admit patient for observation.       Alva Larraine FALCON, PA-C 03/30/24 [id number]    Ula Prentice SAUNDERS, MD 03/30/24 [phone]

## 2024-03-30 NOTE — ED Provider Notes (Signed)
 [place] EMERGENCY DEPARTMENT AT [facility] Provider Note   CSN: [id number] Arrival date & time: 03/30/24  9593     Patient presents with: Loss of Consciousness   Jeanne Hill is a 78 y.o. female.   The history is provided by the patient and medical records.  Loss of Consciousness  78 year old female with history of hypertension, hypothyroidism, sleep apnea, presenting to the ED after syncopal event.  Patient states she got up to use the bathroom and began to feel lightheaded.  Husband found her and she was sitting upright on the toilet but seemed a little unresponsive.  She reports this has happened to her multiple times previously, usually is able to return to baseline within seconds but seemed a little more prolonged today.  States seems to happen when she gets intermittent cramps in her legs, almost like the blood shunts there instead of the rest of her body.  Denies any chest pain, SOB, palpitations, headaches, numbness, weakness.    Prior to Admission medications   Medication Sig Start Date End Date Taking? Authorizing Provider  Calcium Carbonate-Vitamin D (CALCIUM + D PO) Take 1 tablet by mouth daily.     [provider]  cetirizine (ZYRTEC) 10 MG tablet Take 10 mg by mouth daily as needed for allergies.     [provider]  hydrochlorothiazide 25 MG tablet Take 25 mg by mouth daily.      [provider]  ibuprofen  (ADVIL ,MOTRIN ) 600 MG tablet Take 1 tablet (600 mg total) by mouth every 6 (six) hours as needed. 08/15/15   Timmie Norris, MD  levothyroxine  (SYNTHROID , LEVOTHROID) 88 MCG tablet Take 88 mcg by mouth daily before breakfast.    [provider]  lisinopril  (PRINIVIL ,ZESTRIL ) 5 MG tablet Take 5 mg by mouth daily.    [provider]  PARoxetine Mesylate (BRISDELLE) 7.5 MG CAPS Take 1 capsule by mouth at bedtime.    [provider]  triamcinolone  (NASACORT ALLERGY 24HR) 55 MCG/ACT AERO nasal inhaler  Place 2 sprays into the nose daily.    [provider]    Allergies: Patient has no known allergies.    Review of Systems  Cardiovascular:  Positive for syncope.  Neurological:  Positive for syncope.  All other systems reviewed and are negative.   Updated Vital Signs BP 111/66   Pulse (!) 4   Temp 97.8 F (36.6 C) (Axillary)   Resp 12   Ht 5' 4 (1.626 m)   Wt 66.2 kg   SpO2 100%   BMI 25.06 kg/m   Physical Exam Vitals and nursing note reviewed.  Constitutional:      Appearance: She is well-developed.  HENT:     Head: Normocephalic and atraumatic.     Comments: No visible head trauma Eyes:     Conjunctiva/sclera: Conjunctivae normal.     Pupils: Pupils are equal, round, and reactive to light.  Cardiovascular:     Rate and Rhythm: Normal rate and regular rhythm.     Heart sounds: Normal heart sounds.  Pulmonary:     Effort: Pulmonary effort is normal.     Breath sounds: Normal breath sounds.  Abdominal:     General: Bowel sounds are normal.     Palpations: Abdomen is soft.  Musculoskeletal:        General: Normal range of motion.     Cervical back: Normal range of motion.  Skin:    General: Skin is warm and dry.  Neurological:  Mental Status: She is alert and oriented to person, place, and time.     Comments: AAOx3, answering questions and following commands appropriately; equal strength UE and LE bilaterally; CN grossly intact; moves all extremities appropriately without ataxia; no focal neuro deficits or facial asymmetry appreciated     (all labs ordered are listed, but only abnormal results are displayed) Labs Reviewed  COMPREHENSIVE METABOLIC PANEL WITH GFR - Abnormal; Notable for the following components:      Result Value   Glucose, Bld 107 (*)    BUN 25 (*)    Creatinine, Ser 1.41 (*)    Total Protein 6.1 (*)    Albumin 3.4 (*)    GFR, Estimated 38 (*)    All other components within normal limits  RESP PANEL BY RT-PCR (RSV, FLU A&B,  COVID)  RVPGX2  CBC WITH DIFFERENTIAL/PLATELET  BRAIN NATRIURETIC PEPTIDE  TROPONIN I (HIGH SENSITIVITY)  TROPONIN I (HIGH SENSITIVITY)    EKG: EKG Interpretation Date/Time:  Thursday March 30 2024 04:15:13 EDT Ventricular Rate:  62 PR Interval:  205 QRS Duration:  95 QT Interval:  390 QTC Calculation: 396 R Axis:   56  Text Interpretation: Sinus rhythm No significant change since last tracing Confirmed by Lorette Mayo [phone]) on 03/30/2024 6:08:41 AM  Radiology: [facility] Chest Port 1 View Result Date: 03/30/2024 EXAM: 1 VIEW XRAY OF THE CHEST 03/30/2024 05:09:50 AM COMPARISON: None available. CLINICAL HISTORY: Syncope. Bib [facility] from home. Pt got up and went to bathroom and husband found her passed out. FINDINGS: LUNGS AND PLEURA: There are mildly prominent interstitial opacities within the mid and lower lung zones bilaterally, of uncertain chronicity. No focal pulmonary opacity. No pulmonary edema. No pleural effusion. No pneumothorax. HEART AND MEDIASTINUM: No acute abnormality of the cardiac and mediastinal silhouettes. BONES AND SOFT TISSUES: No acute osseous abnormality. IMPRESSION: 1. Mildly prominent interstitial opacities within the mid and lower lung zones bilaterally, of uncertain chronicity. Electronically signed by: Evalene Coho MD 03/30/2024 05:48 AM EDT RP Workstation: [id number]     Procedures   Medications Ordered in the ED - No data to display                                  Medical Decision Making Amount and/or Complexity of Data Reviewed Labs: ordered. Radiology: ordered and independent interpretation performed. ECG/medicine tests: ordered and independent interpretation performed.  Risk Prescription drug management.   78 year old female presenting to the ED after syncopal event.  Got up and went to the bathroom, had syncopal episode on the toilet but did not fall or strike head.  Husband found her sitting upright but mostly unresponsive.  EMS was called.   Sounds like this has happened multiple times previously but much slower to return to baseline today.  He is awake, alert, oriented on arrival to the ED.  She does not have any focal neurologic deficits.  Her vitals are stable.  EKG without any acute ischemia.  Labs reassuring without leukocytosis or electrolyte derangement.  SrCr is slightly above baseline, may be somewhat dry.  Will give some gentle IVF.  6:15 AM Patient continues to feel well here.  She remains without any tachycardia or hypoxia.  Husband now at bedside.  We have discussed her results including reassuring labs aside from renal function but chest x-ray with opacities, uncertain chronicity.  Patient does report recent URI symptoms, a lot of that they were chalking up  to allergens.  She denies any prior history of DVT or PE.  No recent international travel but has been doing a lot of driving in the car back and forth to [facility] to see her sister while hospitalized.  In light of this, will obtain CTA to assess for PE, occult pneumonia.  Will also add on BNP.  Husband does report longstanding history of syncope, has never had formal workup.  She would likely benefit from admission.  Care will be signed out to oncoming provider.  Will follow-up on CTA and admit for syncope work-up.  Final diagnoses:  Recurrent syncope    ED Discharge Orders     None          Jarold Olam CHRISTELLA DEVONNA 03/30/24 [id number]    Lorette Mayo, MD 03/30/24 [phone]

## 2024-03-31 DIAGNOSIS — R55 Syncope and collapse: Secondary | ICD-10-CM | POA: Diagnosis not present

## 2024-03-31 LAB — CBC
HCT: 37.9 % (ref 36.0–46.0)
Hemoglobin: 12.5 g/dL (ref 12.0–15.0)
MCH: 29.8 pg (ref 26.0–34.0)
MCHC: 33 g/dL (ref 30.0–36.0)
MCV: 90.2 fL (ref 80.0–100.0)
Platelets: 198 K/uL (ref 150–400)
RBC: 4.2 MIL/uL (ref 3.87–5.11)
RDW: 13.3 % (ref 11.5–15.5)
WBC: 6.9 K/uL (ref 4.0–10.5)
nRBC: 0 % (ref 0.0–0.2)

## 2024-03-31 LAB — BASIC METABOLIC PANEL WITH GFR
Anion gap: 12 (ref 5–15)
BUN: 15 mg/dL (ref 8–23)
CO2: 23 mmol/L (ref 22–32)
Calcium: 9 mg/dL (ref 8.9–10.3)
Chloride: 109 mmol/L (ref 98–111)
Creatinine, Ser: 1.04 mg/dL — ABNORMAL HIGH (ref 0.44–1.00)
GFR, Estimated: 55 mL/min — ABNORMAL LOW (ref >60)
Glucose, Bld: 106 mg/dL — ABNORMAL HIGH (ref 70–99)
Potassium: 3.8 mmol/L (ref 3.5–5.1)
Sodium: 144 mmol/L (ref 135–145)

## 2024-03-31 MED ORDER — LISINOPRIL 10 MG PO TABS: 10.0000 mg | ORAL_TABLET | Freq: Every day | ORAL | Status: AC

## 2024-03-31 NOTE — Progress Notes (Signed)
 Transition of Care [facility]) - Inpatient Brief Assessment   Patient Details  Name: Jeanne Hill MRN: [id number] Date of Birth: July 11, 1946  [facility]) CM/SW Contact:    Rosaline JONELLE Joe, RN Phone Number: 03/31/2024, 9:29 AM   Clinical Narrative: Patient admitted to the hospital with Syncopal episode.  Patient lives with spouse and plans to return home when medically stable.  No IP Care management needs prior to discharge to home.   Transition of Care Asessment: Insurance and Status: (P) Insurance coverage has been reviewed Patient has primary care physician: (P) Yes Home environment has been reviewed: (P) from home with spouse Prior level of function:: (P) self Prior/Current Home Services: (P) No current home services Social Drivers of Health Review: (P) SDOH reviewed no interventions necessary Readmission risk has been reviewed: (P) Yes Transition of care needs: (P) no transition of care needs at this time

## 2024-03-31 NOTE — Progress Notes (Signed)
 PT Cancellation Note  Patient Details Name: Jeanne Hill MRN: [id number] DOB: 01/13/1946   Cancelled Treatment:    Reason Eval/Treat Not Completed: PT screened, no needs identified, will sign off (Spoke with patient at bedside. Patient reports she back to baseline independence and has been walking without difficulty. She is eager to be discharged as soon as possible to return home. No apparent PT needs at this time.)  Jeanne Hill, PT, MPT  Jeanne Hill 03/31/2024, 8:53 AM

## 2024-03-31 NOTE — Discharge Summary (Signed)
 Physician Discharge Summary  Jeanne Hill FMW:[id number] DOB: [birth year] DOA: 03/30/2024  PCP: No primary care provider on file.  Admit date: 03/30/2024 Discharge date: 03/31/2024  Admitted From: Home Disposition: Home  Recommendations for Outpatient Follow-up:  Follow up with PCP in 1 week with repeat CBC/BMP Outpatient evaluation and follow up with cardiology Right follow up in ED if symptoms worsen or new appear   Home Health: No Equipment/Devices: None  Discharge Condition: Stable CODE STATUS: Full Diet recommendation: Heart healthy  Brief/Interim Summary: 78 y.o. female with medical history significant of hypertension, hypothyroidism, and sleep apnea presented with diarrhea and syncope.  On presentation, creatinine was 1.41, high sensitive troponins x 2 were negative.  CT angiogram of the chest showed no pulmonary embolism.  Patient was treated with IV fluids.  Subsequently, she has not had any more syncope.  Her diarrhea is improving.  2D echo was unremarkable.  She feels okay to go home today.  She will be discharged home today with outpatient follow-up with PCP.  Recommend outpatient evaluation and follow-up by cardiology for recurrent syncope.  Discharge Diagnoses:   Syncope - Possibly vasovagal.  Happened after she had a bowel movement. - Imaging as above.  No more syncope since admission.  Telemetry monitoring unremarkable. -D echo was unremarkable.  She feels okay to go home today.  She will be discharged home today with outpatient follow-up with PCP.  Recommend outpatient evaluation and follow-up by cardiology for recurrent syncope.  Diarrhea - Questional cause.  Improving.  BodyRecommend evaluation and follow-up by GI if diarrhea persists.  Suspected acute kidney injury -No recent creatinine in the system; last creatinine was around 1 back in 2016.  Presented with creatinine of 1.41.  Treated with IV fluids.  Creatinine 1.04 this morning.  Will keep  hydrochlorothiazide on hold.    Essential hypertension - Blood pressure intermittently on the lower side.  Hydrochlorothiazide to remain on hold till reevaluation with PCP.  Resume lisinopril  from 04/01/2024.  Hypothyroidism Continue levothyroxine   OSA -Currently does not use CPAP.  Discharge Instructions  Discharge Instructions     Ambulatory referral to Cardiology   Complete by: As directed    Diet - low sodium heart healthy   Complete by: As directed    Increase activity slowly   Complete by: As directed       Allergies as of 03/31/2024   No Known Allergies      Medication List     STOP taking these medications    hydrochlorothiazide 25 MG tablet Commonly known as: HYDRODIURIL       TAKE these medications    cetirizine 10 MG tablet Commonly known as: ZYRTEC Take 10 mg by mouth daily.   ibuprofen  600 MG tablet Commonly known as: ADVIL  Take 1 tablet (600 mg total) by mouth every 6 (six) hours as needed. What changed: reasons to take this   levothyroxine  88 MCG tablet Commonly known as: SYNTHROID  Take 50-75 mcg by mouth See admin instructions. 50 mcg on weekends, 75 mcg during the weekdays   lisinopril  10 MG tablet Commonly known as: ZESTRIL  Take 1 tablet (10 mg total) by mouth daily. Start taking on: April 01, 2024        Follow-up Information     PCP. Schedule an appointment as soon as possible for a visit in 1 week(s).                 No Known Allergies  Consultations: None   Procedures/Studies: ECHOCARDIOGRAM COMPLETE  Result Date: 03/30/2024    ECHOCARDIOGRAM REPORT   Patient Name:   Jeanne Hill [facility] Date of Exam: 03/30/2024 Medical Rec #:  [id number]         Height:       64.0 in Accession #:    [id number]        Weight:       146.0 lb Date of Birth:  [birth year]         BSA:          1.711 m Patient Age:    37 years          BP:           125/78 mmHg Patient Gender: F                 HR:           75 bpm. Exam Location:  Inpatient  Procedure: 2D Echo, Cardiac Doppler and Color Doppler (Both Spectral and Color            Flow Doppler were utilized during procedure). Indications:    Syncope  History:        Patient has no prior history of Echocardiogram examinations.  Sonographer:    Therisa Crouch Referring Phys: [id number] RONDELL A SMITH IMPRESSIONS  1. Left ventricular ejection fraction, by estimation, is 65 to 70%. The left ventricle has normal function. The left ventricle has no regional wall motion abnormalities. Left ventricular diastolic parameters were normal.  2. Right ventricular systolic function is normal. The right ventricular size is normal.  3. The mitral valve is normal in structure. Trivial mitral valve regurgitation. No evidence of mitral stenosis.  4. The aortic valve is tricuspid. There is mild calcification of the aortic valve. Aortic valve regurgitation is mild. Aortic valve sclerosis/calcification is present, without any evidence of aortic stenosis.  5. The inferior vena cava is dilated in size with >50% respiratory variability, suggesting right atrial pressure of 8 mmHg. FINDINGS  Left Ventricle: Left ventricular ejection fraction, by estimation, is 65 to 70%. The left ventricle has normal function. The left ventricle has no regional wall motion abnormalities. The left ventricular internal cavity size was normal in size. There is  no left ventricular hypertrophy. Left ventricular diastolic parameters were normal. Right Ventricle: The right ventricular size is normal. No increase in right ventricular wall thickness. Right ventricular systolic function is normal. Left Atrium: Left atrial size was normal in size. Right Atrium: Right atrial size was normal in size. Pericardium: There is no evidence of pericardial effusion. Mitral Valve: The mitral valve is normal in structure. Trivial mitral valve regurgitation. No evidence of mitral valve stenosis. Tricuspid Valve: The tricuspid valve is normal in structure. Tricuspid valve  regurgitation is trivial. No evidence of tricuspid stenosis. Aortic Valve: The aortic valve is tricuspid. There is mild calcification of the aortic valve. Aortic valve regurgitation is mild. Aortic valve sclerosis/calcification is present, without any evidence of aortic stenosis. Aortic valve mean gradient measures 6.0 mmHg. Aortic valve peak gradient measures 11.3 mmHg. Aortic valve area, by VTI measures 2.37 cm. Pulmonic Valve: The pulmonic valve was normal in structure. Pulmonic valve regurgitation is not visualized. No evidence of pulmonic stenosis. Aorta: The aortic root is normal in size and structure. Venous: The inferior vena cava is dilated in size with greater than 50% respiratory variability, suggesting right atrial pressure of 8 mmHg. IAS/Shunts: No atrial level shunt detected by color flow Doppler.  LEFT VENTRICLE PLAX 2D LVIDd:  4.49 cm     Diastology LVIDs:         3.59 cm     LV e' medial:    9.90 cm/s LV PW:         0.70 cm     LV E/e' medial:  7.3 LV IVS:        0.68 cm     LV e' lateral:   10.10 cm/s LVOT diam:     2.01 cm     LV E/e' lateral: 7.1 LV SV:         74 LV SV Index:   43 LVOT Area:     3.17 cm  LV Volumes (MOD) LV vol d, MOD A2C: 51.3 ml LV vol d, MOD A4C: 55.6 ml LV vol s, MOD A2C: 16.1 ml LV vol s, MOD A4C: 15.3 ml LV SV MOD A2C:     35.2 ml LV SV MOD A4C:     55.6 ml LV SV MOD BP:      38.9 ml RIGHT VENTRICLE             IVC RV Basal diam:  3.06 cm     IVC diam: 2.03 cm RV S prime:     13.10 cm/s TAPSE (M-mode): 1.8 cm LEFT ATRIUM             Index LA diam:        2.47 cm 1.44 cm/m LA Vol (A2C):   31.9 ml 18.64 ml/m LA Vol (A4C):   18.8 ml 10.98 ml/m LA Biplane Vol: 25.3 ml 14.78 ml/m  AORTIC VALVE AV Area (Vmax):    2.21 cm AV Area (Vmean):   2.21 cm AV Area (VTI):     2.37 cm AV Vmax:           168.00 cm/s AV Vmean:          113.000 cm/s AV VTI:            0.312 m AV Peak Grad:      11.3 mmHg AV Mean Grad:      6.0 mmHg LVOT Vmax:         117.00 cm/s LVOT Vmean:         78.600 cm/s LVOT VTI:          0.233 m LVOT/AV VTI ratio: 0.75  AORTA Ao Root diam: 2.80 cm Ao Asc diam:  3.14 cm MITRAL VALVE MV Area (PHT): 5.27 cm    SHUNTS MV Decel Time: 144 msec    Systemic VTI:  0.23 m MV E velocity: 71.80 cm/s  Systemic Diam: 2.01 cm MV A velocity: 93.30 cm/s MV E/A ratio:  0.77 Toribio Fuel MD Electronically signed by Toribio Fuel MD Signature Date/Time: 03/30/2024/2:26:45 PM    Final    CT Angio Chest PE W and/or Wo Contrast Addendum Date: 03/30/2024 ADDENDUM REPORT: 03/30/2024 07:02 ADDENDUM: As noted in the body of the report, the ascending thoracic aorta measures 4.0 cm diameter. Recommend annual imaging followup by CTA or MRA. This recommendation follows 2010 ACCF/AHA/AATS/ACR/ASA/SCA/SCAI/SIR/STS/SVM Guidelines for the Diagnosis and Management of Patients with Thoracic Aortic Disease. Circulation. 2010; 121: Z733-z630. Aortic aneurysm NOS (ICD10-I71.9) Electronically Signed   By: Camellia Candle M.D.   On: 03/30/2024 07:02   Result Date: 03/30/2024 CLINICAL DATA:  Patient was found down. EXAM: CT ANGIOGRAPHY CHEST WITH CONTRAST TECHNIQUE: Multidetector CT imaging of the chest was performed using the standard protocol during bolus administration of intravenous contrast. Multiplanar CT image reconstructions and  MIPs were obtained to evaluate the vascular anatomy. RADIATION DOSE REDUCTION: This exam was performed according to the departmental dose-optimization program which includes automated exposure control, adjustment of the mA and/or kV according to patient size and/or use of iterative reconstruction technique. CONTRAST:  75mL OMNIPAQUE  IOHEXOL  350 MG/ML SOLN COMPARISON:  None Available. FINDINGS: Cardiovascular: The heart size is normal. No substantial pericardial effusion. Mild atherosclerotic calcification is noted in the wall of the thoracic aorta. Ascending thoracic aorta measures 4 cm diameter. There is no filling defect within the opacified pulmonary arteries to  suggest the presence of an acute pulmonary embolus. Mediastinum/Nodes: No mediastinal lymphadenopathy. Large left thyroid  nodule evident. This has been evaluated on previous imaging. (ref: J Am Coll Radiol. 2015 Feb;12(2): 143-50).There is no hilar lymphadenopathy. Calcified nodal tissue in the right hilum suggest remote granulomatous disease. The esophagus has normal imaging features. There is no axillary lymphadenopathy. Lungs/Pleura: No suspicious pulmonary nodule or mass. Mosaic ground-glass attenuation in both lungs is nonspecific but may reflect underlying air trapping from small airways disease. No dense focal consolidative pneumonia. No pleural effusion. Upper Abdomen: Visualized portion of the upper abdomen shows no acute findings. Musculoskeletal: No worrisome lytic or sclerotic osseous abnormality. Review of the MIP images confirms the above findings. IMPRESSION: 1. No CT evidence for acute pulmonary embolus. 2. Mosaic ground-glass attenuation in both lungs is nonspecific but may reflect underlying air trapping from small airways disease. 3.  Aortic Atherosclerosis (ICD10-I70.0). Electronically Signed: By: Camellia Candle M.D. On: 03/30/2024 06:57   DG Chest Port 1 View Result Date: 03/30/2024 EXAM: 1 VIEW XRAY OF THE CHEST 03/30/2024 05:09:50 AM COMPARISON: None available. CLINICAL HISTORY: Syncope. [facility] from home. Pt got up and went to bathroom and husband found her passed out. FINDINGS: LUNGS AND PLEURA: There are mildly prominent interstitial opacities within the mid and lower lung zones bilaterally, of uncertain chronicity. No focal pulmonary opacity. No pulmonary edema. No pleural effusion. No pneumothorax. HEART AND MEDIASTINUM: No acute abnormality of the cardiac and mediastinal silhouettes. BONES AND SOFT TISSUES: No acute osseous abnormality. IMPRESSION: 1. Mildly prominent interstitial opacities within the mid and lower lung zones bilaterally, of uncertain chronicity. Electronically  signed by: Evalene Coho MD 03/30/2024 05:48 AM EDT RP Workstation: [id number]      Subjective: Patient seen and examined at bedside.  Feels much better and wants to go home today.  No syncope since admission.  Diarrhea is improving.  Discharge Exam: Vitals:   03/31/24 0353 03/31/24 0822  BP: 125/68 137/63  Pulse: 73 66  Resp: 16 20  Temp: 98.4 F (36.9 C) 97.9 F (36.6 C)  SpO2: 96% 98%    General: Pt is alert, awake, not in acute distress Cardiovascular: rate controlled, S1/S2 + Respiratory: bilateral decreased breath sounds at bases Abdominal: Soft, NT, ND, bowel sounds + Extremities: no edema, no cyanosis    The results of significant diagnostics from this hospitalization (including imaging, microbiology, ancillary and laboratory) are listed below for reference.     Microbiology: Recent Results (from the past 240 hours)  Resp panel by RT-PCR (RSV, Flu A&B, Covid) Anterior Nasal Swab     Status: None   Collection Time: 03/30/24  6:19 AM   Specimen: Anterior Nasal Swab  Result Value Ref Range Status   SARS Coronavirus 2 by RT PCR NEGATIVE NEGATIVE Final   Influenza A by PCR NEGATIVE NEGATIVE Final   Influenza B by PCR NEGATIVE NEGATIVE Final    Comment: (NOTE) The Xpert Xpress SARS-CoV-2/FLU/RSV plus  assay is intended as an aid in the diagnosis of influenza from Nasopharyngeal swab specimens and should not be used as a sole basis for treatment. Nasal washings and aspirates are unacceptable for Xpert Xpress SARS-CoV-2/FLU/RSV testing.  Fact Sheet for Patients: BloggerCourse.com  Fact Sheet for Healthcare Providers: SeriousBroker.it  This test is not yet approved or cleared by the [place]  FDA and has been authorized for detection and/or diagnosis of SARS-CoV-2 by FDA under an Emergency Use Authorization (EUA). This EUA will remain in effect (meaning this test can be used) for the duration of  the COVID-19 declaration under Section 564(b)(1) of the Act, 21 U.S.C. section 360bbb-3(b)(1), unless the authorization is terminated or revoked.     Resp Syncytial Virus by PCR NEGATIVE NEGATIVE Final    Comment: (NOTE) Fact Sheet for Patients: BloggerCourse.com  Fact Sheet for Healthcare Providers: SeriousBroker.it  This test is not yet approved or cleared by the [place]  FDA and has been authorized for detection and/or diagnosis of SARS-CoV-2 by FDA under an Emergency Use Authorization (EUA). This EUA will remain in effect (meaning this test can be used) for the duration of the COVID-19 declaration under Section 564(b)(1) of the Act, 21 U.S.C. section 360bbb-3(b)(1), unless the authorization is terminated or revoked.  Performed at [facility], [address], [place], [place] [address]      Labs: BNP (last 3 results) Recent Labs    03/30/24 0655  BNP 37.1   Basic Metabolic Panel: Recent Labs  Lab 03/30/24 0444 03/31/24 0203  NA 137 144  K 4.0 3.8  CL 104 109  CO2 22 23  GLUCOSE 107* 106*  BUN 25* 15  CREATININE 1.41* 1.04*  CALCIUM 10.2 9.0   Liver Function Tests: Recent Labs  Lab 03/30/24 0444  AST 30  ALT 22  ALKPHOS 59  BILITOT 0.6  PROT 6.1*  ALBUMIN 3.4*   No results for input(s): LIPASE, AMYLASE in the last 168 hours. No results for input(s): AMMONIA in the last 168 hours. CBC: Recent Labs  Lab 03/30/24 0444 03/31/24 0203  WBC 8.5 6.9  NEUTROABS 5.9  --   HGB 13.7 12.5  HCT 40.9 37.9  MCV 89.7 90.2  PLT 209 198   Cardiac Enzymes: No results for input(s): CKTOTAL, CKMB, CKMBINDEX, TROPONINI in the last 168 hours. BNP: Invalid input(s): POCBNP CBG: No results for input(s): GLUCAP in the last 168 hours. D-Dimer No results for input(s): DDIMER in the last 72 hours. Hgb A1c No results for input(s): HGBA1C in the last 72 hours. Lipid Profile No  results for input(s): CHOL, HDL, LDLCALC, TRIG, CHOLHDL, LDLDIRECT in the last 72 hours. Thyroid  function studies Recent Labs    03/30/24 1800  TSH 1.166   Anemia work up No results for input(s): VITAMINB12, FOLATE, FERRITIN, TIBC, IRON, RETICCTPCT in the last 72 hours. Urinalysis    Component Value Date/Time   COLORURINE STRAW (A) 03/30/2024 1740   APPEARANCEUR CLEAR 03/30/2024 1740   LABSPEC 1.011 03/30/2024 1740   PHURINE 5.0 03/30/2024 1740   GLUCOSEU NEGATIVE 03/30/2024 1740   HGBUR NEGATIVE 03/30/2024 1740   BILIRUBINUR NEGATIVE 03/30/2024 1740   KETONESUR NEGATIVE 03/30/2024 1740   PROTEINUR NEGATIVE 03/30/2024 1740   NITRITE NEGATIVE 03/30/2024 1740   LEUKOCYTESUR NEGATIVE 03/30/2024 1740   Sepsis Labs Recent Labs  Lab 03/30/24 0444 03/31/24 0203  WBC 8.5 6.9   Microbiology Recent Results (from the past 240 hours)  Resp panel by RT-PCR (RSV, Flu A&B, Covid) Anterior Nasal Swab  Status: None   Collection Time: 03/30/24  6:19 AM   Specimen: Anterior Nasal Swab  Result Value Ref Range Status   SARS Coronavirus 2 by RT PCR NEGATIVE NEGATIVE Final   Influenza A by PCR NEGATIVE NEGATIVE Final   Influenza B by PCR NEGATIVE NEGATIVE Final    Comment: (NOTE) The Xpert Xpress SARS-CoV-2/FLU/RSV plus assay is intended as an aid in the diagnosis of influenza from Nasopharyngeal swab specimens and should not be used as a sole basis for treatment. Nasal washings and aspirates are unacceptable for Xpert Xpress SARS-CoV-2/FLU/RSV testing.  Fact Sheet for Patients: BloggerCourse.com  Fact Sheet for Healthcare Providers: SeriousBroker.it  This test is not yet approved or cleared by the [place]  FDA and has been authorized for detection and/or diagnosis of SARS-CoV-2 by FDA under an Emergency Use Authorization (EUA). This EUA will remain in effect (meaning this test can be used) for the  duration of the COVID-19 declaration under Section 564(b)(1) of the Act, 21 U.S.C. section 360bbb-3(b)(1), unless the authorization is terminated or revoked.     Resp Syncytial Virus by PCR NEGATIVE NEGATIVE Final    Comment: (NOTE) Fact Sheet for Patients: BloggerCourse.com  Fact Sheet for Healthcare Providers: SeriousBroker.it  This test is not yet approved or cleared by the [place]  FDA and has been authorized for detection and/or diagnosis of SARS-CoV-2 by FDA under an Emergency Use Authorization (EUA). This EUA will remain in effect (meaning this test can be used) for the duration of the COVID-19 declaration under Section 564(b)(1) of the Act, 21 U.S.C. section 360bbb-3(b)(1), unless the authorization is terminated or revoked.  Performed at [facility], [address], [place], [place] [address]      Time coordinating discharge: 35 minutes  SIGNED:   Sophie Mao, MD  [facility] 03/31/2024, 9:22 AM

## 2024-03-31 NOTE — Progress Notes (Signed)
 Mobility Specialist: Progress Note   03/31/24 1000  Mobility  Activity Ambulated independently  Level of Assistance Independent  Assistive Device None  Distance Ambulated (ft) 1000 ft  Activity Response Tolerated well  Mobility Referral Yes  Mobility visit 1 Mobility  Mobility Specialist Start Time (ACUTE ONLY) 0915  Mobility Specialist Stop Time (ACUTE ONLY) 0926  Mobility Specialist Time Calculation (min) (ACUTE ONLY) 11 min    Pt received in bed, agreeable to mobility session. No complaints. Returned to room without fault. Left in bed with all needs met, call bell in reach.    Ileana Lute Mobility Specialist Please contact via SecureChat or Rehab office at [phone]

## 2024-04-11 DIAGNOSIS — K589 Irritable bowel syndrome without diarrhea: Secondary | ICD-10-CM | POA: Diagnosis not present

## 2024-04-11 DIAGNOSIS — R55 Syncope and collapse: Secondary | ICD-10-CM | POA: Diagnosis not present

## 2024-04-11 DIAGNOSIS — I10 Essential (primary) hypertension: Secondary | ICD-10-CM | POA: Diagnosis not present

## 2024-04-11 DIAGNOSIS — E039 Hypothyroidism, unspecified: Secondary | ICD-10-CM | POA: Diagnosis not present

## 2024-04-13 DIAGNOSIS — H35342 Macular cyst, hole, or pseudohole, left eye: Secondary | ICD-10-CM | POA: Diagnosis not present

## 2024-04-20 DIAGNOSIS — Z86018 Personal history of other benign neoplasm: Secondary | ICD-10-CM | POA: Diagnosis not present

## 2024-04-20 DIAGNOSIS — I10 Essential (primary) hypertension: Secondary | ICD-10-CM | POA: Diagnosis not present

## 2024-05-22 DIAGNOSIS — Z860101 Personal history of adenomatous and serrated colon polyps: Secondary | ICD-10-CM | POA: Diagnosis not present

## 2024-05-22 DIAGNOSIS — Z09 Encounter for follow-up examination after completed treatment for conditions other than malignant neoplasm: Secondary | ICD-10-CM | POA: Diagnosis not present

## 2024-05-22 DIAGNOSIS — K5730 Diverticulosis of large intestine without perforation or abscess without bleeding: Secondary | ICD-10-CM | POA: Diagnosis not present

## 2024-05-22 DIAGNOSIS — K648 Other hemorrhoids: Secondary | ICD-10-CM | POA: Diagnosis not present

## 2024-06-14 ENCOUNTER — Encounter: Payer: Self-pay | Admitting: Cardiology

## 2024-06-14 ENCOUNTER — Ambulatory Visit: Attending: Cardiology | Admitting: Cardiology

## 2024-06-14 VITALS — BP 120/65 | HR 66 | Ht 64.0 in | Wt 138.0 lb

## 2024-06-14 DIAGNOSIS — I77810 Thoracic aortic ectasia: Secondary | ICD-10-CM | POA: Insufficient documentation

## 2024-06-14 DIAGNOSIS — I351 Nonrheumatic aortic (valve) insufficiency: Secondary | ICD-10-CM | POA: Insufficient documentation

## 2024-06-14 DIAGNOSIS — I10 Essential (primary) hypertension: Secondary | ICD-10-CM | POA: Diagnosis not present

## 2024-06-14 DIAGNOSIS — R55 Syncope and collapse: Secondary | ICD-10-CM | POA: Diagnosis not present

## 2024-06-14 NOTE — Patient Instructions (Addendum)
 Medication Instructions:  Your physician recommends that you continue on your current medications as directed. Please refer to the Current Medication list given to you today.  *If you need a refill on your cardiac medications before your next appointment, please call your pharmacy*  Lab Work: In August of 2026: BMET If you have labs (blood work) drawn today and your tests are completely normal, you will receive your results only by: MyChart Message (if you have MyChart) OR A paper copy in the mail If you have any lab test that is abnormal or we need to change your treatment, we will call you to review the results.  Testing/Procedures: Non-Cardiac CT Angiography (CTA) in 03/2025, is a special type of CT scan that uses a computer to produce multi-dimensional views of major blood vessels throughout the body. In CT angiography, a contrast material is injected through an IV to help visualize the blood vessels   Your physician has requested that you have an echocardiogram in 03/2025. Echocardiography is a painless test that uses sound waves to create images of your heart. It provides your doctor with information about the size and shape of your heart and how well your heart's chambers and valves are working. This procedure takes approximately one hour. There are no restrictions for this procedure. Please do NOT wear cologne, perfume, aftershave, or lotions (deodorant is allowed). Please arrive 15 minutes prior to your appointment time.  Please note: We ask at that you not bring children with you during ultrasound (echo/ vascular) testing. Due to room size and safety concerns, children are not allowed in the ultrasound rooms during exams. Our front office staff cannot provide observation of children in our lobby area while testing is being conducted. An adult accompanying a patient to their appointment will only be allowed in the ultrasound room at the discretion of the ultrasound technician under special  circumstances. We apologize for any inconvenience.   Follow-Up: At [facility], you and your health needs are our priority.  As part of our continuing mission to provide you with exceptional heart care, our providers are all part of one team.  This team includes your primary Cardiologist (physician) and Advanced Practice Providers or APPs (Physician Assistants and Nurse Practitioners) who all work together to provide you with the care you need, when you need it.  Your next appointment:    September 2026  Provider:   Newman JINNY Lawrence, MD

## 2024-06-14 NOTE — Progress Notes (Signed)
 Cardiology Office Note:  .   Date:  06/14/2024  ID:  Jeanne Hill, DOB Dec 27, 1945, MRN [id number] PCP: Elliot Charm, MD  [facility] Providers Cardiologist:  Newman Lawrence, MD PCP: Elliot Charm, MD  Chief Complaint  Patient presents with   Loss of Consciousness     Jeanne Hill is a 78 y.o. female with ascending aorta dilatation, mild aortic regurgitation, syncope  Discussed the use of AI scribe software for clinical note transcription with the patient, who gave verbal consent to proceed.  History of Present Illness Jeanne Hill is a 78 year old female who presents with vasovagal syncope triggered by abdominal cramps.  She experiences vasovagal syncope associated with severe abdominal cramps, occurring approximately once every couple of years. The most recent episode in August required a hospital visit. An echocardiogram at that time showed normal heart function. A CT scan previously indicated a slightly dilated aorta measuring four centimeters, and an echocardiogram revealed mild valve leakiness.  She recently began taking generic Crestor for slightly elevated cholesterol, with her first dose taken yesterday. No other significant medical issues are noted prior to the syncope episodes.      Vitals:   06/14/24 1026  BP: 120/65  Pulse: 66  SpO2: 98%      Review of Systems  Cardiovascular:  Positive for syncope. Negative for chest pain, dyspnea on exertion, leg swelling and palpitations.        Studies Reviewed: Jeanne Hill        EKG 06/14/2024: Normal sinus rhythm Normal ECG When compared with ECG of 30-Mar-2024 04:15, No significant change was found     Echocardiogram 03/2024:  1. Left ventricular ejection fraction, by estimation, is 65 to 70%. The  left ventricle has normal function. The left ventricle has no regional  wall motion abnormalities. Left ventricular diastolic parameters were  normal.   2. Right ventricular  systolic function is normal. The right ventricular  size is normal.   3. The mitral valve is normal in structure. Trivial mitral valve  regurgitation. No evidence of mitral stenosis.   4. The aortic valve is tricuspid. There is mild calcification of the  aortic valve. Aortic valve regurgitation is mild. Aortic valve  sclerosis/calcification is present, without any evidence of aortic  stenosis.   5. The inferior vena cava is dilated in size with >50% respiratory  variability, suggesting right atrial pressure of 8 mmHg.   Labs 03/2024: Hb 12.5 Cr 1.04, eGFR 55 BNP 37, Trop HS 6 TSH 1.1   Physical Exam Vitals and nursing note reviewed.  Constitutional:      General: She is not in acute distress. Neck:     Vascular: No JVD.  Cardiovascular:     Rate and Rhythm: Normal rate and regular rhythm.     Heart sounds: Normal heart sounds. No murmur heard. Pulmonary:     Effort: Pulmonary effort is normal.     Breath sounds: Normal breath sounds. No wheezing or rales.  Musculoskeletal:     Right lower leg: No edema.     Left lower leg: No edema.      VISIT DIAGNOSES:   ICD-10-CM   1. Essential hypertension  I10 EKG 12-Lead    2. Vasovagal syncope  R55 EKG 12-Lead    3. Ascending aorta dilatation  I77.810 Basic Metabolic Panel (BMET)    CT ANGIO CHEST AORTA W/CM & OR WO/CM    4. Nonrheumatic aortic valve insufficiency  I35.1 ECHOCARDIOGRAM COMPLETE  Jeanne Hill is a 78 y.o. female with ascending aorta dilatation, mild aortic regurgitation, syncope Assessment & Plan Vasovagal syncope: Episodes triggered by severe abdominal cramps, occurring approximately once every couple of years. Echocardiogram showed normal heart function, no cardiac cause for syncope. - Advise hydration: 2-3 liters of water daily. - Educate on recognizing triggers and preventive measures: sit or lie down quickly, drink water rapidly when feeling an episode coming on. - Instruct on counter  pressure maneuvers to prevent syncope. - No heart monitor needed due to infrequency of episodes. - Follow up in September after CT angiogram and echocardiogram in August unless episodes recur despite preventive measures.  Ascending aorta dilatation:  Slight dilation of the aorta at 4 cm on CT scan, not significant but requires monitoring. Echocardiogram showed mild aortic valve leak. - Repeat CT angiogram of the aorta in August next year. - Perform echocardiogram in August next year to compare with CT findings. - Advise against lifting heavy weights to avoid increasing aortic pressure. - Encourage regular aerobic activity such as walking.  Nonrheumatic aortic valve insufficiency Mild regurgitation of the aortic valve noted on echocardiogram. - Perform echocardiogram in August next year to monitor valve insufficiency.  Mixed hyperlipidemia: Recently started on Crestor 5 mg daily, which can be uptitrated as tolerated to achieve LDL levels <70.      No orders of the defined types were placed in this encounter.    F/u in 04/2025  Signed, Newman JINNY Lawrence, MD

## 2025-03-14 ENCOUNTER — Ambulatory Visit (HOSPITAL_COMMUNITY)

## 2025-03-19 ENCOUNTER — Ambulatory Visit (HOSPITAL_COMMUNITY)

## 2025-03-19 ENCOUNTER — Other Ambulatory Visit (HOSPITAL_COMMUNITY)
